# Patient Record
Sex: Female | Born: 1985 | Race: Black or African American | Hispanic: No | Marital: Single | Smoking: Current every day smoker
Health system: Southern US, Community
[De-identification: ages and names within clinical notes are randomized; demographics above are authoritative.]

## PROBLEM LIST (undated history)

## (undated) DIAGNOSIS — F319 Bipolar disorder, unspecified: Secondary | ICD-10-CM

## (undated) DIAGNOSIS — K5010 Crohn's disease of large intestine without complications: Secondary | ICD-10-CM

## (undated) DIAGNOSIS — Z87820 Personal history of traumatic brain injury: Secondary | ICD-10-CM

## (undated) DIAGNOSIS — F329 Major depressive disorder, single episode, unspecified: Secondary | ICD-10-CM

## (undated) DIAGNOSIS — F32A Depression, unspecified: Secondary | ICD-10-CM

## (undated) DIAGNOSIS — T7840XA Allergy, unspecified, initial encounter: Secondary | ICD-10-CM

## (undated) DIAGNOSIS — F419 Anxiety disorder, unspecified: Secondary | ICD-10-CM

## (undated) DIAGNOSIS — D649 Anemia, unspecified: Secondary | ICD-10-CM

## (undated) DIAGNOSIS — K219 Gastro-esophageal reflux disease without esophagitis: Secondary | ICD-10-CM

## (undated) DIAGNOSIS — Z8709 Personal history of other diseases of the respiratory system: Secondary | ICD-10-CM

## (undated) HISTORY — DX: Anemia, unspecified: D64.9

## (undated) HISTORY — PX: WISDOM TOOTH EXTRACTION: SHX21

## (undated) HISTORY — DX: Bipolar disorder, unspecified: F31.9

## (undated) HISTORY — DX: Crohn's disease of large intestine without complications: K50.10

## (undated) HISTORY — DX: Allergy, unspecified, initial encounter: T78.40XA

## (undated) HISTORY — PX: INNER EAR SURGERY: SHX679

## (undated) HISTORY — PX: UMBILICAL HERNIA REPAIR: SHX196

## (undated) HISTORY — PX: OTHER SURGICAL HISTORY: SHX169

## (undated) HISTORY — PX: TONSILLECTOMY: SUR1361

---
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
## 2005-01-26 ENCOUNTER — Encounter (INDEPENDENT_AMBULATORY_CARE_PROVIDER_SITE_OTHER): Payer: Self-pay | Admitting: Specialist

## 2005-01-26 ENCOUNTER — Inpatient Hospital Stay (HOSPITAL_COMMUNITY): Admission: AD | Admit: 2005-01-26 | Discharge: 2005-01-29 | Payer: Self-pay | Admitting: *Deleted

## 2005-01-26 ENCOUNTER — Ambulatory Visit: Payer: Self-pay | Admitting: Obstetrics & Gynecology

## 2005-02-26 ENCOUNTER — Inpatient Hospital Stay (HOSPITAL_COMMUNITY): Admission: AD | Admit: 2005-02-26 | Discharge: 2005-02-27 | Payer: Self-pay | Admitting: Obstetrics & Gynecology

## 2005-02-26 ENCOUNTER — Ambulatory Visit: Payer: Self-pay | Admitting: Certified Nurse Midwife

## 2005-02-26 ENCOUNTER — Emergency Department (HOSPITAL_COMMUNITY): Admission: AD | Admit: 2005-02-26 | Discharge: 2005-02-26 | Payer: Self-pay | Admitting: Family Medicine

## 2005-03-10 ENCOUNTER — Inpatient Hospital Stay (HOSPITAL_COMMUNITY): Admission: AD | Admit: 2005-03-10 | Discharge: 2005-03-10 | Payer: Self-pay | Admitting: Obstetrics and Gynecology

## 2005-12-15 ENCOUNTER — Emergency Department (HOSPITAL_COMMUNITY): Admission: EM | Admit: 2005-12-15 | Discharge: 2005-12-15 | Payer: Self-pay | Admitting: Emergency Medicine

## 2006-11-03 ENCOUNTER — Emergency Department (HOSPITAL_COMMUNITY): Admission: EM | Admit: 2006-11-03 | Discharge: 2006-11-03 | Payer: Self-pay | Admitting: Emergency Medicine

## 2007-02-19 ENCOUNTER — Inpatient Hospital Stay (HOSPITAL_COMMUNITY): Admission: AD | Admit: 2007-02-19 | Discharge: 2007-02-19 | Payer: Self-pay | Admitting: Obstetrics & Gynecology

## 2007-02-19 ENCOUNTER — Ambulatory Visit: Payer: Self-pay | Admitting: *Deleted

## 2007-02-28 ENCOUNTER — Emergency Department (HOSPITAL_COMMUNITY): Admission: EM | Admit: 2007-02-28 | Discharge: 2007-03-01 | Payer: Self-pay | Admitting: Emergency Medicine

## 2007-04-24 ENCOUNTER — Encounter: Payer: Self-pay | Admitting: Obstetrics and Gynecology

## 2007-04-24 ENCOUNTER — Ambulatory Visit: Payer: Self-pay | Admitting: Obstetrics & Gynecology

## 2007-05-02 ENCOUNTER — Ambulatory Visit (HOSPITAL_COMMUNITY): Admission: RE | Admit: 2007-05-02 | Discharge: 2007-05-02 | Payer: Self-pay | Admitting: Obstetrics and Gynecology

## 2007-05-22 ENCOUNTER — Ambulatory Visit: Payer: Self-pay | Admitting: Obstetrics & Gynecology

## 2007-05-26 ENCOUNTER — Emergency Department (HOSPITAL_COMMUNITY): Admission: EM | Admit: 2007-05-26 | Discharge: 2007-05-26 | Payer: Self-pay | Admitting: Emergency Medicine

## 2007-06-10 ENCOUNTER — Ambulatory Visit: Payer: Self-pay | Admitting: Obstetrics and Gynecology

## 2007-06-10 ENCOUNTER — Inpatient Hospital Stay (HOSPITAL_COMMUNITY): Admission: AD | Admit: 2007-06-10 | Discharge: 2007-06-10 | Payer: Self-pay | Admitting: Obstetrics and Gynecology

## 2007-06-12 ENCOUNTER — Inpatient Hospital Stay (HOSPITAL_COMMUNITY): Admission: AD | Admit: 2007-06-12 | Discharge: 2007-06-15 | Payer: Self-pay | Admitting: Obstetrics & Gynecology

## 2007-06-12 ENCOUNTER — Ambulatory Visit: Payer: Self-pay | Admitting: Obstetrics and Gynecology

## 2007-06-12 ENCOUNTER — Ambulatory Visit: Payer: Self-pay | Admitting: Obstetrics & Gynecology

## 2007-12-27 ENCOUNTER — Emergency Department (HOSPITAL_COMMUNITY): Admission: EM | Admit: 2007-12-27 | Discharge: 2007-12-27 | Payer: Self-pay | Admitting: Emergency Medicine

## 2008-03-28 ENCOUNTER — Emergency Department (HOSPITAL_COMMUNITY): Admission: EM | Admit: 2008-03-28 | Discharge: 2008-03-28 | Payer: Self-pay | Admitting: Emergency Medicine

## 2008-04-03 ENCOUNTER — Emergency Department (HOSPITAL_COMMUNITY): Admission: EM | Admit: 2008-04-03 | Discharge: 2008-04-03 | Payer: Self-pay | Admitting: Emergency Medicine

## 2008-09-26 ENCOUNTER — Emergency Department (HOSPITAL_COMMUNITY): Admission: EM | Admit: 2008-09-26 | Discharge: 2008-09-26 | Payer: Self-pay | Admitting: Family Medicine

## 2008-12-09 ENCOUNTER — Ambulatory Visit (HOSPITAL_COMMUNITY): Admission: RE | Admit: 2008-12-09 | Discharge: 2008-12-09 | Payer: Self-pay | Admitting: Obstetrics

## 2008-12-23 ENCOUNTER — Ambulatory Visit (HOSPITAL_COMMUNITY): Admission: RE | Admit: 2008-12-23 | Discharge: 2008-12-23 | Payer: Self-pay | Admitting: Obstetrics

## 2009-02-02 ENCOUNTER — Ambulatory Visit (HOSPITAL_COMMUNITY): Admission: RE | Admit: 2009-02-02 | Discharge: 2009-02-02 | Payer: Self-pay | Admitting: Obstetrics

## 2009-05-08 ENCOUNTER — Inpatient Hospital Stay (HOSPITAL_COMMUNITY): Admission: AD | Admit: 2009-05-08 | Discharge: 2009-05-10 | Payer: Self-pay | Admitting: Obstetrics

## 2010-05-03 ENCOUNTER — Emergency Department (HOSPITAL_COMMUNITY)
Admission: EM | Admit: 2010-05-03 | Discharge: 2010-05-03 | Payer: Self-pay | Source: Home / Self Care | Admitting: Family Medicine

## 2010-05-15 DIAGNOSIS — Z8709 Personal history of other diseases of the respiratory system: Secondary | ICD-10-CM

## 2010-05-15 HISTORY — DX: Personal history of other diseases of the respiratory system: Z87.09

## 2010-05-15 NOTE — L&D Delivery Note (Signed)
Delivery Note At 12:52 PM a viable female was delivered via  (Presentation: ;  ).  APGAR: 9, 10; weight .   Placenta status: , .  Cord: 3 vessels with the following complications: .  Cord pH: not done  Anesthesia: Epidural  Episiotomy:  Lacerations:  Suture Repair: 2.0 Est. Blood Loss (mL):   Mom to postpartum.  Baby to nursery-stable.  Lindzee Gouge A 01/31/2011, 1:03 PM

## 2010-06-05 ENCOUNTER — Encounter: Payer: Self-pay | Admitting: *Deleted

## 2010-06-06 ENCOUNTER — Encounter: Payer: Self-pay | Admitting: Obstetrics

## 2010-07-25 LAB — POCT PREGNANCY, URINE: Preg Test, Ur: NEGATIVE

## 2010-08-04 LAB — HEPATITIS B SURFACE ANTIGEN: Hepatitis B Surface Ag: NEGATIVE

## 2010-08-04 LAB — ANTIBODY SCREEN: Antibody Screen: NEGATIVE

## 2010-08-15 LAB — CBC
HCT: 32.7 % — ABNORMAL LOW (ref 36.0–46.0)
Hemoglobin: 10 g/dL — ABNORMAL LOW (ref 12.0–15.0)
Hemoglobin: 11 g/dL — ABNORMAL LOW (ref 12.0–15.0)
MCV: 92.6 fL (ref 78.0–100.0)
Platelets: 224 10*3/uL (ref 150–400)
Platelets: 276 10*3/uL (ref 150–400)
RBC: 3.21 MIL/uL — ABNORMAL LOW (ref 3.87–5.11)
RBC: 3.54 MIL/uL — ABNORMAL LOW (ref 3.87–5.11)
RDW: 14.6 % (ref 11.5–15.5)
WBC: 6.9 10*3/uL (ref 4.0–10.5)

## 2010-08-15 LAB — RPR: RPR Ser Ql: NONREACTIVE

## 2010-08-22 ENCOUNTER — Inpatient Hospital Stay (INDEPENDENT_AMBULATORY_CARE_PROVIDER_SITE_OTHER)
Admission: RE | Admit: 2010-08-22 | Discharge: 2010-08-22 | Disposition: A | Discharge status: Home / Self Care | Payer: Medicaid Other | Source: Ambulatory Visit | Attending: Emergency Medicine | Admitting: Emergency Medicine

## 2010-08-22 DIAGNOSIS — J309 Allergic rhinitis, unspecified: Secondary | ICD-10-CM

## 2010-08-22 DIAGNOSIS — J45909 Unspecified asthma, uncomplicated: Secondary | ICD-10-CM

## 2010-08-22 DIAGNOSIS — H1045 Other chronic allergic conjunctivitis: Secondary | ICD-10-CM

## 2010-08-28 ENCOUNTER — Inpatient Hospital Stay (INDEPENDENT_AMBULATORY_CARE_PROVIDER_SITE_OTHER)
Admission: RE | Admit: 2010-08-28 | Discharge: 2010-08-28 | Disposition: A | Discharge status: Home / Self Care | Payer: Medicaid Other | Source: Ambulatory Visit | Attending: Emergency Medicine | Admitting: Emergency Medicine

## 2010-08-28 DIAGNOSIS — H1045 Other chronic allergic conjunctivitis: Secondary | ICD-10-CM

## 2010-08-28 DIAGNOSIS — H1130 Conjunctival hemorrhage, unspecified eye: Secondary | ICD-10-CM

## 2010-09-27 NOTE — Op Note (Signed)
NAME:  Julie Mcconnell, Julie Mcconnell NO.:  [id number]   MEDICAL RECORD NO.:  [id number]          PATIENT TYPE:  INP   LOCATION:                                FACILITY:  [facility]   PHYSICIAN:  Jonnie Kind, M.D. DATE OF BIRTH:  18-Jun-1985   DATE OF PROCEDURE:  DATE OF DISCHARGE:                               OPERATIVE REPORT   DELIVERY TIME:  5:56 a.m. on the 29th.   SUMMARY:  [name] progressed through labor in her vaginal birth after  cesarean trial of labor.  She was 3 to 4 cm, 80%, minus 1 at vertex  presentation at midnight when she had amniotomy with moderate meconium  identified.  She progressed nicely on Pitocin augmentation of labor  reaching 8 cm at 4 a.m. and progressing to completely dilated by 5:15.  She pushed for approximately 30 minutes, delivering from a right occiput  anterior position, a healthy female infant, Apgars 9, 9.  The delivery was  notable for a second median perineal laceration and a left periurethral  laceration.  There was nuchal cord x1.  The baby delivered without any  difficulty.  The right shoulder which was in posterior position being  released first.  The baby was otherwise delivered easily.  There has  been light meconium earlier.  The baby cried vigorously as soon as the  head was delivered, so no DeLee suction was necessary or attempted.  The  Apgars 9, 9 were assigned.  Weight _____.  Secondary perineal laceration  was repaired under local anesthesia as was the left periurethral  laceration.  The periurethral area had four interrupted sutures of 2-0  plain, sewed very superficially, and the posterior perineal body was  repaired using continuous running 2-0 Vicryl with good tissue edge  approximation.  EBL 500 cc.  Placenta was intact with membranes  requiring teasing out but visually intact.      Jonnie Kind, M.D.  Electronically Signed     JVF/MEDQ  D:  06/13/2007  T:  06/13/2007  Job:  [id number]

## 2010-09-30 NOTE — Discharge Summary (Signed)
Julie Mcconnell, HUMM NO.:  [id number]   MEDICAL RECORD NO.:  [id number]          PATIENT TYPE:  INP   LOCATION:  9137                          FACILITY:  [facility]   PHYSICIAN:  Cranston Neighbor, M.D.DATE OF BIRTH:  07/16/1985   DATE OF ADMISSION:  01/26/2005  DATE OF DISCHARGE:  01/29/2005                                 DISCHARGE SUMMARY   ADMISSION DIAGNOSES:  1.  Intrauterine pregnancy at 39-6/7 weeks.  2.  Fetal bradycardia.  3.  Active herpetic lesions.   DISCHARGE DIAGNOSES:  1.  Intrauterine pregnancy at 39-6/7 weeks.  2.  Fetal bradycardia.  3.  Active herpetic lesions.   PROCEDURE:  Lower transverse cesarean section.  Please see operative report  for more information.   ADMITTING HISTORY AND PHYSICAL:  The patient is a 25 year old G1 at 39-6/7  weeks sent in __________ for variable decelerations and had an episode of  fetal bradycardia in MAU.  Also states she has an active herpetic lesion.   She has no allergies.   MEDICATIONS:  She takes albuterol and Zyrtec.   PAST MEDICAL HISTORY:  Asthma.   SOCIAL HISTORY:  She took four cigarettes a day, no ethanol or drugs.  She  lives in [place].   PHYSICAL EXAMINATION:  VITAL SIGNS:  Temperature 98.1, pulse 96, respiratory  rate 18, blood pressure 122/93.  GENERAL:  A well-developed, well-nourished female in no acute distress.  HEENT:  Normocephalic, atraumatic.  CHEST:  Lungs were clear to auscultation bilaterally.  CARDIAC:  Normal S1, S2, no murmurs, gallops or rubs.  ABDOMEN:  Gravid.  PELVIC:  She is 1 cm, -3 and 30%.   As the patient stated she had an active herpetic lesion, a C-section was  performed.  The fetal bradycardia resolved prior to C-section.   HOSPITAL COURSE:  The patient had her C-section on January 26, 2005.  The  patient remained stable.  Past the C-section, her vital signs remained  stable.  The patient had no complaints.  She was discharged on postop day  #3.  The  patient was discharged to [facility].  She was to follow up in  six weeks at [facility] for an IUD as well as a postpartum check.  Her  activity was no sex or anything vaginally for six weeks and not to lift  anything heavier than the baby.   Her medications were Percocet, ibuprofen, Colace, iron and prenatal  vitamins.  Her Percocet, she was given 20 of those.     ______________________________  Julie Mcconnell. Julie Mormon, MD    ______________________________  Cranston Neighbor, M.D.    ABC/MEDQ  D:  01/29/2005  T:  01/30/2005  Job:  [id number]

## 2010-09-30 NOTE — Op Note (Signed)
NAME:  Julie Mcconnell, Julie Mcconnell NO.:  [id number]   MEDICAL RECORD NO.:  [id number]          PATIENT TYPE:  INP   LOCATION:  9137                          FACILITY:  [facility]   PHYSICIAN:  Guss Bunde, M.D. DATE OF BIRTH:  [birth year]   DATE OF PROCEDURE:  01/26/2005  DATE OF DISCHARGE:                                 OPERATIVE REPORT   PREOPERATIVE DIAGNOSES:  A 25 year old, gravida 1, para 0 female at 62 weeks  6 days estimated gestational age with:  1.  Fetal bradycardia.  2.  Active herpes simplex virus lesions.   POSTOPERATIVE DIAGNOSES:  A 25 year old, gravida 1, para 0 female at 61  weeks 6 days estimated gestational age with:  1.  Fetal bradycardia.  2.  Active herpes simplex virus lesions.   OPERATION/PROCEDURE:  Primary low flap transverse cesarean section.   SURGEON:  Guss Bunde, M.D.   ASSISTANTJanace Hoard B. Nigel Mormon, M.D.   ANESTHESIA:  Spinal.   SPECIMENS:  To pathology, placenta.   ESTIMATED BLOOD LOSS:  800 mL.   COMPLICATIONS:  None.   FINDINGS:  A viable female infant, Apgars to be determined and reported by  the neonatologist, weight 7 pounds 14 ounces.  Nuchal cord x1.  Clear fluid.  Arterial cord PH 7.33.  Grossly normal uterus, ovaries and fallopian tube.  NICU attending at delivery.   DESCRIPTION OF PROCEDURE:  After informed consent was obtained, the patient  was taken to the operating room where spinal anesthesia was found to be  adequate.  The patient was placed in the dorsal supine position with a  leftward tilt and prepped and draped in the normal sterile fashion.  The  Foley was placed in the bladder.   A Pfannenstiel skin incision was made with the scalpel carried down to the  underlying fascia.  The fascia was incised in the midline and this incision  was extended bilaterally with the Mayo scissors.  The superior and inferior  aspects of the fascial incision were grasped with Kocher clamps, tented up,  dissected off sharply  and bluntly from underlying layers of rectus muscles.  The rectus muscles were separated in the midline.  The peritoneum was  entered sharply and this incision was extended both superiorly and  inferiorly with good visualization of the bladder.  The bladder blade was  inserted.  The uterine incision was made in transverse fashion in the lower  uterine segment.  This incision was extended bilaterally bluntly.  The  baby's head was delivered without incident and the nose and mouth were  suctioned.  The rest of the baby's body delivered easily.  The cord was  clamped and cut and the baby was handed off to the waiting pediatrician.  Cord blood was sent for type and screen and an arterial cord gas was sent  which was noted above.  .  Placenta delivered manually with three-vessel  cord and the uterus was cleared of all clots and debris.   The uterine incision was closed with 0 Vicryl in a running locked fashion.  A second suture of 0 Vicryl was  used to reinforce this incision.  The uterus  was noted to be hemostatic off tension.  The gutters were cleared of all  clots and debris, and rectus muscles and peritoneum were noted to be  hemostatic.  The fascia was closed with 0 Vicryl in a running fashion.  Good  hemostasis was noted once again.  The subcutaneous tissue was copiously  irrigated and noted to be hemostatic.  The skin was closed with staples.  The patient tolerated the procedure well.  Sponge, lab, instrument, and  needle counts were x2.  The patient went to the recovery room in stable  condition.           ______________________________  Guss Bunde, M.D.     KHL/MEDQ  D:  01/26/2005  T:  01/27/2005  Job:  [id number]

## 2010-12-27 LAB — STREP B DNA PROBE: GBS: NEGATIVE

## 2011-01-04 ENCOUNTER — Inpatient Hospital Stay (INDEPENDENT_AMBULATORY_CARE_PROVIDER_SITE_OTHER)
Admission: RE | Admit: 2011-01-04 | Discharge: 2011-01-04 | Disposition: A | Discharge status: Home / Self Care | Payer: Medicaid Other | Source: Ambulatory Visit | Attending: Emergency Medicine | Admitting: Emergency Medicine

## 2011-01-04 DIAGNOSIS — J40 Bronchitis, not specified as acute or chronic: Secondary | ICD-10-CM

## 2011-01-31 ENCOUNTER — Encounter (HOSPITAL_COMMUNITY): Payer: Self-pay | Admitting: Anesthesiology

## 2011-01-31 ENCOUNTER — Encounter (HOSPITAL_COMMUNITY): Payer: Self-pay | Admitting: *Deleted

## 2011-01-31 ENCOUNTER — Inpatient Hospital Stay (HOSPITAL_COMMUNITY)
Admission: AD | Admit: 2011-01-31 | Discharge: 2011-02-02 | DRG: 775 | Disposition: A | Discharge status: Home / Self Care | Payer: Medicaid Other | Source: Ambulatory Visit | Attending: Obstetrics & Gynecology | Admitting: Obstetrics & Gynecology

## 2011-01-31 ENCOUNTER — Inpatient Hospital Stay (HOSPITAL_COMMUNITY): Payer: Medicaid Other | Admitting: Anesthesiology

## 2011-01-31 DIAGNOSIS — Z98891 History of uterine scar from previous surgery: Secondary | ICD-10-CM | POA: Diagnosis present

## 2011-01-31 DIAGNOSIS — IMO0001 Reserved for inherently not codable concepts without codable children: Secondary | ICD-10-CM

## 2011-01-31 DIAGNOSIS — O34219 Maternal care for unspecified type scar from previous cesarean delivery: Principal | ICD-10-CM | POA: Diagnosis present

## 2011-01-31 DIAGNOSIS — IMO0002 Reserved for concepts with insufficient information to code with codable children: Secondary | ICD-10-CM | POA: Clinically undetermined

## 2011-01-31 LAB — CBC
HCT: 31.6 % — ABNORMAL LOW (ref 36.0–46.0)
MCH: 30.3 pg (ref 26.0–34.0)
MCHC: 33.2 g/dL (ref 30.0–36.0)
RDW: 14.3 % (ref 11.5–15.5)

## 2011-01-31 LAB — RPR: RPR Ser Ql: NONREACTIVE

## 2011-01-31 MED ORDER — FERROUS SULFATE 325 (65 FE) MG PO TABS
325.0000 mg | ORAL_TABLET | Freq: Two times a day (BID) | ORAL | Status: DC
  Administered 2011-01-31 – 2011-02-02 (×4): 325 mg via ORAL
  Filled 2011-01-31 (×4): qty 1

## 2011-01-31 MED ORDER — ZOLPIDEM TARTRATE 5 MG PO TABS: 5.0000 mg | ORAL_TABLET | Freq: Every evening | ORAL | Status: DC | PRN

## 2011-01-31 MED ORDER — ONDANSETRON HCL 4 MG PO TABS: 4.0000 mg | ORAL_TABLET | ORAL | Status: DC | PRN

## 2011-01-31 MED ORDER — DIPHENHYDRAMINE HCL 25 MG PO CAPS: 25.0000 mg | ORAL_CAPSULE | Freq: Four times a day (QID) | ORAL | Status: DC | PRN

## 2011-01-31 MED ORDER — PHENYLEPHRINE 40 MCG/ML (10ML) SYRINGE FOR IV PUSH (FOR BLOOD PRESSURE SUPPORT)
80.0000 ug | PREFILLED_SYRINGE | INTRAVENOUS | Status: DC | PRN
  Filled 2011-01-31: qty 5

## 2011-01-31 MED ORDER — TERBUTALINE SULFATE 1 MG/ML IJ SOLN: 0.2500 mg | Freq: Once | INTRAMUSCULAR | Status: DC | PRN

## 2011-01-31 MED ORDER — LANOLIN HYDROUS EX OINT: TOPICAL_OINTMENT | CUTANEOUS | Status: DC | PRN

## 2011-01-31 MED ORDER — ONDANSETRON HCL 4 MG/2ML IJ SOLN: 4.0000 mg | INTRAMUSCULAR | Status: DC | PRN

## 2011-01-31 MED ORDER — LACTATED RINGERS IV SOLN: 500.0000 mL | Freq: Once | INTRAVENOUS | Status: DC

## 2011-01-31 MED ORDER — BENZOCAINE-MENTHOL 20-0.5 % EX AERO: 1.0000 "application " | INHALATION_SPRAY | CUTANEOUS | Status: DC | PRN

## 2011-01-31 MED ORDER — OXYCODONE-ACETAMINOPHEN 5-325 MG PO TABS
2.0000 | ORAL_TABLET | ORAL | Status: DC | PRN
  Filled 2011-01-31: qty 1

## 2011-01-31 MED ORDER — ONDANSETRON HCL 4 MG/2ML IJ SOLN: 4.0000 mg | Freq: Four times a day (QID) | INTRAMUSCULAR | Status: DC | PRN

## 2011-01-31 MED ORDER — SIMETHICONE 80 MG PO CHEW: 80.0000 mg | CHEWABLE_TABLET | ORAL | Status: DC | PRN

## 2011-01-31 MED ORDER — FENTANYL 2.5 MCG/ML BUPIVACAINE 1/10 % EPIDURAL INFUSION (WH - ANES)
14.0000 mL/h | INTRAMUSCULAR | Status: DC
  Administered 2011-01-31 (×3): 14 mL/h via EPIDURAL
  Filled 2011-01-31 (×3): qty 60

## 2011-01-31 MED ORDER — EPHEDRINE 5 MG/ML INJ
10.0000 mg | INTRAVENOUS | Status: DC | PRN
  Filled 2011-01-31 (×2): qty 4

## 2011-01-31 MED ORDER — OXYTOCIN 20 UNITS IN LACTATED RINGERS INFUSION - SIMPLE
125.0000 mL/h | Freq: Once | INTRAVENOUS | Status: AC
  Administered 2011-01-31: 999 mL/h via INTRAVENOUS

## 2011-01-31 MED ORDER — ACETAMINOPHEN 325 MG PO TABS: 650.0000 mg | ORAL_TABLET | ORAL | Status: DC | PRN

## 2011-01-31 MED ORDER — LACTATED RINGERS IV SOLN
INTRAVENOUS | Status: DC
  Administered 2011-01-31: 03:00:00 via INTRAVENOUS
  Administered 2011-01-31: 125 mL/h via INTRAVENOUS
  Administered 2011-01-31: 999 mL/h via INTRAVENOUS

## 2011-01-31 MED ORDER — PRENATAL PLUS 27-1 MG PO TABS
1.0000 | ORAL_TABLET | Freq: Every day | ORAL | Status: DC
  Administered 2011-01-31 – 2011-02-02 (×3): 1 via ORAL
  Filled 2011-01-31 (×3): qty 1

## 2011-01-31 MED ORDER — IBUPROFEN 600 MG PO TABS
600.0000 mg | ORAL_TABLET | Freq: Four times a day (QID) | ORAL | Status: DC
  Administered 2011-01-31 – 2011-02-02 (×8): 600 mg via ORAL
  Filled 2011-01-31 (×8): qty 1

## 2011-01-31 MED ORDER — OXYTOCIN BOLUS FROM INFUSION
500.0000 mL | Freq: Once | INTRAVENOUS | Status: DC
  Filled 2011-01-31: qty 500
  Filled 2011-01-31: qty 1000

## 2011-01-31 MED ORDER — IBUPROFEN 600 MG PO TABS: 600.0000 mg | ORAL_TABLET | Freq: Four times a day (QID) | ORAL | Status: DC | PRN

## 2011-01-31 MED ORDER — SENNOSIDES-DOCUSATE SODIUM 8.6-50 MG PO TABS: 2.0000 | ORAL_TABLET | Freq: Every day | ORAL | Status: DC

## 2011-01-31 MED ORDER — OXYCODONE-ACETAMINOPHEN 5-325 MG PO TABS
1.0000 | ORAL_TABLET | ORAL | Status: DC | PRN
  Administered 2011-02-01: 0.5 via ORAL

## 2011-01-31 MED ORDER — PHENYLEPHRINE 40 MCG/ML (10ML) SYRINGE FOR IV PUSH (FOR BLOOD PRESSURE SUPPORT)
80.0000 ug | PREFILLED_SYRINGE | INTRAVENOUS | Status: DC | PRN
  Filled 2011-01-31 (×2): qty 5

## 2011-01-31 MED ORDER — DIPHENHYDRAMINE HCL 50 MG/ML IJ SOLN: 12.5000 mg | INTRAMUSCULAR | Status: DC | PRN

## 2011-01-31 MED ORDER — DIBUCAINE 1 % RE OINT: 1.0000 "application " | TOPICAL_OINTMENT | RECTAL | Status: DC | PRN

## 2011-01-31 MED ORDER — FLEET ENEMA 7-19 GM/118ML RE ENEM: 1.0000 | ENEMA | RECTAL | Status: DC | PRN

## 2011-01-31 MED ORDER — WITCH HAZEL-GLYCERIN EX PADS: 1.0000 "application " | MEDICATED_PAD | CUTANEOUS | Status: DC | PRN

## 2011-01-31 MED ORDER — LIDOCAINE HCL (PF) 1 % IJ SOLN
30.0000 mL | INTRAMUSCULAR | Status: DC | PRN
  Filled 2011-01-31 (×2): qty 30

## 2011-01-31 MED ORDER — SODIUM BICARBONATE 8.4 % IV SOLN
INTRAVENOUS | Status: DC | PRN
  Administered 2011-01-31: 4 mL via EPIDURAL

## 2011-01-31 MED ORDER — EPHEDRINE 5 MG/ML INJ
10.0000 mg | INTRAVENOUS | Status: DC | PRN
  Filled 2011-01-31: qty 4

## 2011-01-31 MED ORDER — CITRIC ACID-SODIUM CITRATE 334-500 MG/5ML PO SOLN: 30.0000 mL | ORAL | Status: DC | PRN

## 2011-01-31 MED ORDER — LACTATED RINGERS IV SOLN: 500.0000 mL | INTRAVENOUS | Status: DC | PRN

## 2011-01-31 MED ORDER — OXYTOCIN 20 UNITS IN LACTATED RINGERS INFUSION - SIMPLE
1.0000 m[IU]/min | INTRAVENOUS | Status: DC
  Administered 2011-01-31: 2 m[IU]/min via INTRAVENOUS
  Filled 2011-01-31: qty 1000

## 2011-01-31 MED ORDER — TETANUS-DIPHTH-ACELL PERTUSSIS 5-2.5-18.5 LF-MCG/0.5 IM SUSP
0.5000 mL | Freq: Once | INTRAMUSCULAR | Status: AC
  Administered 2011-02-01: 0.5 mL via INTRAMUSCULAR

## 2011-01-31 NOTE — Progress Notes (Signed)
Dr. Ruthann Cancer at bedside and notified of pt status, FHR, and UC pattern.  AROM, will continue to monitor.

## 2011-01-31 NOTE — Progress Notes (Signed)
Dr. Ruthann Cancer notified of pt status, SVE, FHR, UC pattern.  Will continue to monitor.

## 2011-01-31 NOTE — Progress Notes (Signed)
Report given to Cyndy Freeze for transfer of care

## 2011-01-31 NOTE — Consult Note (Signed)
Neonatology Note:   Attendance at Delivery:    I was asked to attend this NSVD at term due to meconium-stained fluid. The mother is a G4P3 AB pos, GBS neg with  ROM 3-4 hours PTD, meconium in fluid. No fetal distress. Upon delivery of the head, Dr. Ruthann Cancer bulb suctioned for thick, mucousy, green fluid. I did further bulb suctioning as the cord was being clamped. Infant vigorous with good spontaneous cry and tone. Needed only minimal bulb suctioning. Ap 9/10. Lungs clear to ausc in DR. To CN to care of Pediatrician.   Caleb Popp, MD

## 2011-01-31 NOTE — Anesthesia Procedure Notes (Signed)
Epidural Patient location during procedure: OB Start time: 01/31/2011 4:43 AM  Staffing Anesthesiologist: Riccardo Dubin  Preanesthetic Checklist Completed: patient identified, site marked, surgical consent, pre-op evaluation, timeout performed, IV checked, risks and benefits discussed and monitors and equipment checked  Epidural Patient position: sitting Prep: site prepped and draped and DuraPrep Patient monitoring: continuous pulse ox and blood pressure Approach: midline Injection technique: LOR air  Needle:  Needle type: Tuohy  Needle gauge: 17 G Needle length: 9 cm Needle insertion depth: 7 cm Catheter type: closed end flexible Catheter size: 19 Gauge Catheter at skin depth: 14 cm Test dose: negative  Assessment Events: blood not aspirated, injection not painful, no injection resistance, negative IV test and paresthesia (Right side -transient)  Additional Notes Dosing of Epidural: 1st dose, through needle...... ( mg expressed as equavilent  cc's medication  from .1%Bupiv / fentanyl syringe from L&D pump)...............  106m Marcaine  2nd dose, through catheter after waiting 3 minutes.... epi 1:200K + Xylocaine 40 mg 3rd dose, through catheter, after waiting 3 minutes....Marland KitchenMarland Kitchenpi 1:200K + Xylocaine 40 mg ( 2% Xylo charted as a single dose in Epic Meds for ease of charting; actual dosing was fractionated as above, for saftey's sake)  As each dose occurred, patient was free of IV sx; and patient exhibited no evidence of SA injection.  Patient is more comfortable after epidural dosed. Please see RN's note for documentation of vital signs,and FHR which are stable.

## 2011-01-31 NOTE — Progress Notes (Signed)
Dr. Ruthann Cancer notified of pt status, SVE, intact membranes, FHR, and UC pattern.  Will continue to monitor.

## 2011-01-31 NOTE — Progress Notes (Signed)
Pt reports contractions q 5-6 minutes. ,

## 2011-01-31 NOTE — Progress Notes (Signed)
Pt states she has been contracting since 2324

## 2011-01-31 NOTE — Anesthesia Preprocedure Evaluation (Addendum)
Anesthesia Evaluation  Name, MR# and DOB Patient awake  General Assessment Comment  Reviewed: Allergy & Precautions, H&P , Patient's Chart, lab work & pertinent test results  Airway Mallampati: III TM Distance: >3 FB Neck ROM: full    Dental  (+) Teeth Intact   Pulmonary  clear to auscultation  breath sounds clear to auscultation none    Cardiovascular regular Normal    Neuro/Psych   GI/Hepatic/Renal   Endo/Other  (+)   Morbid obesity  Abdominal   Musculoskeletal   Hematology   Peds  Reproductive/Obstetrics (+) Pregnancy    Anesthesia Other Findings  Limiyted mouth opening              Anesthesia Physical Anesthesia Plan  ASA: III  Anesthesia Plan: Epidural   Post-op Pain Management:    Induction:   Airway Management Planned:   Additional Equipment:   Intra-op Plan:   Post-operative Plan:   Informed Consent: I have reviewed the patients History and Physical, chart, labs and discussed the procedure including the risks, benefits and alternatives for the proposed anesthesia with the patient or authorized representative who has indicated his/her understanding and acceptance.   Dental Advisory Given  Plan Discussed with: CRNA and Surgeon  Anesthesia Plan Comments: (Labs checked- platelets confirmed with RN in room. Fetal heart tracing, per RN, reportedly stable enough for sitting procedure. Discussed epidural, and patient consents to the procedure:  included risk of possible headache,backache, failed block, allergic reaction, and nerve injury. This patient was asked if she had any questions or concerns before the procedure started. )        Anesthesia Quick Evaluation

## 2011-01-31 NOTE — Progress Notes (Signed)
Dr. Delsa Sale at the bedside and notified of pt status, FHR, UC pattern, SVE, and epidural placement. Will continue to monitor.

## 2011-01-31 NOTE — H&P (Signed)
Julie Mcconnell is a 25 y.o. female presenting for UCs. Maternal Medical History:  Reason for admission: Reason for admission: contractions.  Reason for Admission:   nauseaContractions: Onset was 6-12 hours ago.   Frequency: regular.   Perceived severity is strong.    Fetal activity: Perceived fetal activity is normal.   Last perceived fetal movement was within the past hour.    Prenatal complications: no prenatal complications   OB History    Grav Para Term Preterm Abortions TAB SAB Ect Mult Living   4 3 3       3      Past Medical History  Diagnosis Date  . No pertinent past medical history    Past Surgical History  Procedure Date  . Cesarean section   . Tonsillectomy    Family History: family history includes Heart disease in her maternal grandmother. Social History:  reports that she has been smoking Cigarettes.  She has a 12 pack-year smoking history. She has never used smokeless tobacco. She reports that she drinks about .6 ounces of alcohol per week. She reports that she does not use illicit drugs.  Review of Systems  Constitutional: Negative for fever.  Eyes: Negative for blurred vision.  Respiratory: Negative for shortness of breath.   Gastrointestinal: Negative for nausea and vomiting.  Skin: Negative for rash.  Neurological: Negative for headaches.    Dilation: 9 Effacement (%): 100 Station: -1 Exam by:: Philis Pique, RN Blood pressure 123/75, pulse 68, temperature 98.1 F (36.7 C), temperature source Oral, resp. rate 20, height 5' 6.5" (1.689 m), weight 103.42 kg (228 lb), SpO2 98.00%. Maternal Exam:  Uterine Assessment: Contraction strength is moderate.  Contraction frequency is regular.   Abdomen: Patient reports no abdominal tenderness. Surgical scars: low transverse.   Fetal presentation: vertex  Introitus: not evaluated.     Fetal Exam Fetal Monitor Review: Variability: moderate (6-25 bpm).   Pattern: accelerations present and no decelerations.     Fetal State Assessment: Category I - tracings are normal.     Physical Exam  Constitutional: She appears well-developed.  HENT:  Head: Normocephalic.  Neck: Neck supple. No thyromegaly present.  Cardiovascular: Normal rate and regular rhythm.   Respiratory: Breath sounds normal.  GI: Soft. Bowel sounds are normal.  Skin: No rash noted.    Prenatal labs: ABO, Rh: AB/Positive/-- (03/22 0000) Antibody: Negative (03/22 0000) Rubella:   RPR: Nonreactive (03/22 0000)  HBsAg: Negative (03/22 0000)  HIV:    GBS: Negative (08/14 0000)   Assessment/Plan: 25 y.o. w/an IUP @ 17w3dw/a h/o VBAC, now in active labor  Admit Anticipate an NSVE   [name],[name] 01/31/2011, 12:24 PM

## 2011-02-01 LAB — POCT URINALYSIS DIP (DEVICE)
Bilirubin Urine: NEGATIVE
Glucose, UA: NEGATIVE
Ketones, ur: NEGATIVE
Operator id: 120861
Specific Gravity, Urine: 1.02

## 2011-02-01 LAB — CBC
Hemoglobin: 9.2 g/dL — ABNORMAL LOW (ref 12.0–15.0)
Platelets: 174 10*3/uL (ref 150–400)
RBC: 3.04 MIL/uL — ABNORMAL LOW (ref 3.87–5.11)
WBC: 9.8 10*3/uL (ref 4.0–10.5)

## 2011-02-01 MED ORDER — INFLUENZA VIRUS VACC SPLIT PF IM SUSP
0.5000 mL | Freq: Once | INTRAMUSCULAR | Status: AC
  Administered 2011-02-02: 0.5 mL via INTRAMUSCULAR
  Filled 2011-02-01: qty 0.5

## 2011-02-01 NOTE — Progress Notes (Signed)
PSYCHOSOCIAL ASSESSMENT ~ MATERNAL/CHILD  Name: Julie Mcconnell Age: 25  Referral Date: 09 /19 / 12  Reason/Source: Social situation/ CN  I. FAMILY/HOME ENVIRONMENT  A. Child's Legal Guardian __X_Parent(s) ___Grandparent ___Foster parent ___DSS_________________  Name: Carrie Mew DOB: // Age: 33  Address: [address]; [place], [place] [address]  Name: Jetta Lout, Brooke Bonito. DOB: // Age:  Address:  B. Other Household Members/Support Persons Name: Relationship: daughter DOB 05/08/09  Name: Relationship: son DOB 06/13/07  Name: Relationship: daughter DOB 01/26/05  Name: Relationship: DOB ___/___/___  C. Other Support: Eduard Clos, mother  II. PSYCHOSOCIAL DATA A. Information Source __Patient Interview _X_Family Interview __Other___________ B. Occupational hygienist __Employment: * receives SSI  _X_Medicaid [employer]: [employer] __Private Insurance: __Self Pay  _X_Food Stamps __WIC *will apply __Work First __Public Housing __Section 8  __Maternity Care Coordination/Child Service Coordination/Early Intervention  ___School: Grade:  __Other:  Loletha Grayer Cultural and Environment Information Cultural Issues Impacting Care:  III. 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 SOCIAL WORK ASSESSMENT Sw referral received to assess reason for security status, as it was reported that "pt does not want the FOB here." Pt told Sw that she requested to be a security pt because she does not want her former foster mother to visit. She explained that their relationship is strained at the present time and she didn't feel like dealing with her. She denies any issues (abuse) between her and FOB and told Sw that he can come visit with her. Pt's mother is at the bedside and  supportive. Pt has a diagnoses of MR. She lives alone with her children. She does have an open treatment CPS case with Foster Simpson, [facility] CPS. Sw spoke with [place], who does not have any concerns about the infant discharging with the mom. He plans to continue working with her. The pt appears to be appropriate with the infant. She has supplies for the baby and support from family. Sw available to assist until discharge if needed.  V. SOCIAL WORK PLAN _X__No Further Intervention Required/No Barriers to Discharge  ___Psychosocial Support and Ongoing Assessment of Needs  ___Patient/Family Education:  ___Child Protective Services Report County___________ Date___/____/____  ___Information/Referral to [employer] Resources_________________________  ___Other:

## 2011-02-01 NOTE — Progress Notes (Signed)
  Post Partum Day 1 S/P spontaneous vaginal RH status/Rubella reviewed.  Feeding: bottle Subjective: No HA, SOB, CP, F/C, breast symptoms. Normal vaginal bleeding, no clots.     Objective: BP 94/59  Pulse 79  Temp(Src) 98.2 F (36.8 C) (Oral)  Resp 18  Ht 5' 6.5" (1.689 m)  Wt 103.42 kg (228 lb)  BMI 36.25 kg/m2  SpO2 98%  Breastfeeding? Unknown   Physical Exam:  General: alert Lochia: appropriate Uterine Fundus: firm DVT Evaluation: No evidence of DVT seen on physical exam. Ext: No c/c/e  Basename 02/01/11 0515 01/31/11 0255  HGB 9.2* 10.5*  HCT 28.3* 31.6*      Assessment/Plan: 25 y.o.  PPD #1 .  normal postpartum exam Continue current postpartum care  Ambulate   LOS: 1 day   [name],Darcus Edds A 02/01/2011, 9:07 AM

## 2011-02-01 NOTE — Anesthesia Postprocedure Evaluation (Signed)
Anesthesia Post Note  Patient: Julie Mcconnell  Procedure(s) Performed: * No procedures listed *  Anesthesia type: Epidural  Patient location: Mother/Baby  Post pain: Pain level controlled  Post assessment: Post-op Vital signs reviewed  Last Vitals:  Filed Vitals:   02/01/11 0538  BP: 94/59  Pulse: 79  Temp: 98.2 F (36.8 C)  Resp: 18    Post vital signs: Reviewed  Level of consciousness: awake  Complications: No apparent anesthesia complications

## 2011-02-01 NOTE — Addendum Note (Signed)
Addendum  created 02/01/11 4037 by [place] edited:Charges VN, Notes Section

## 2011-02-01 NOTE — Progress Notes (Signed)
UR chart review completed.  

## 2011-02-02 DIAGNOSIS — IMO0002 Reserved for concepts with insufficient information to code with codable children: Secondary | ICD-10-CM | POA: Clinically undetermined

## 2011-02-02 LAB — CBC
HCT: 23.9 — ABNORMAL LOW
HCT: 30 — ABNORMAL LOW
Hemoglobin: 10.4 — ABNORMAL LOW
MCHC: 34.2
MCV: 92
Platelets: 229
RBC: 2.58 — ABNORMAL LOW
RBC: 3.27 — ABNORMAL LOW
RBC: 3.42 — ABNORMAL LOW
WBC: 6.1
WBC: 7
WBC: 8.5

## 2011-02-02 LAB — POCT URINALYSIS DIP (DEVICE)
Glucose, UA: NEGATIVE
Hgb urine dipstick: NEGATIVE
Operator id: 159681
Specific Gravity, Urine: 1.02

## 2011-02-02 LAB — COMPREHENSIVE METABOLIC PANEL
AST: 14
BUN: 8
CO2: 23
Chloride: 107
Creatinine, Ser: 0.4
GFR calc non Af Amer: >60
Glucose, Bld: 99
Total Bilirubin: 0.7

## 2011-02-02 LAB — URIC ACID: Uric Acid, Serum: 3.7

## 2011-02-02 LAB — RPR: RPR Ser Ql: NONREACTIVE

## 2011-02-02 MED ORDER — IBUPROFEN 800 MG PO TABS: 800.0000 mg | ORAL_TABLET | Freq: Three times a day (TID) | ORAL | Status: AC | PRN

## 2011-02-02 MED ORDER — FERROUS SULFATE 325 (65 FE) MG PO TABS: 325.0000 mg | ORAL_TABLET | Freq: Two times a day (BID) | ORAL | Status: DC

## 2011-02-02 NOTE — Discharge Summary (Signed)
  Obstetric Discharge Summary Reason for Admission: onset of labor Prenatal Procedures: none Intrapartum Procedures: spontaneous vaginal delivery Postpartum Procedures: none Complications-Operative and Postpartum: none  Hemoglobin  Date Value Range Status  02/01/2011 9.2* 12.0-15.0 (g/dL) Final     HCT  Date Value Range Status  02/01/2011 28.3* 36.0-46.0 (%) Final    Discharge Diagnoses: Term Pregnancy-delivered  Discharge Information: Date: 02/02/2011 Activity: pelvic rest Diet: routine Medications: Ibuprofen, FeSO4 Condition: stable Instructions: refer to routine discharge instructions Discharge to: home Follow-up Information    Follow up with Lahoma Crocker A, MD. Call in 3 weeks.   Contact information:   [address] [phone]          Newborn Data: Live born  Information for the patient's newborn:  Veneta, Sliter [[id number]]  female ; APGAR , ; weight ;  Home with mother.  [name],Shubh Chiara A 02/02/2011, 10:18 AM

## 2011-02-02 NOTE — Progress Notes (Signed)
Post Partum Day #2 S/P:spontaneous vaginal  RH status/Rubella reviewed.  Feeding: unknown Subjective: No HA, SOB, CP, F/C, breast symptoms: No. Normal vaginal bleeding, no clots.     Objective:  Blood pressure 122/81, pulse 87, temperature 98 F (36.7 C), temperature source Oral, resp. rate 20, height 5' 6.5" (1.689 m), weight 103.42 kg (228 lb), SpO2 98.00%, unknown if currently breastfeeding.   Physical Exam:  General: alert Lochia: appropriate Uterine Fundus: firm DVT Evaluation: No evidence of DVT seen on physical exam. Ext: No c/c/e  Basename 02/01/11 0515 01/31/11 0255  HGB 9.2* 10.5*  HCT 28.3* 31.6*    Assessment/Plan: 25 y.o.  PPD # 2 .  normal postpartum exam Continue current postpartum care D/C home   LOS: 2 days   [name],Jostin Rue A 02/02/2011, 10:12 AM

## 2011-02-02 NOTE — Discharge Instructions (Signed)
Anemia - Nonspecific Your exam and blood tests show you are anemic. This means your blood (hemoglobin) level is low. Normal hemoglobin values are 12-15 for females and 14-17 for males. Make a note of your hemoglobin level today. The hematocrit percent is also used to measure anemia. A normal hematocrit is 38-46 in females and 42-49 in males. Make a note of your hematocrit level today. SYMPTOMS Anemia can come on suddenly (acute). It can also come on slowly (chronic). Symptoms can include:  Minor weakness.   Dizziness.   Palpitations.  Shortness of breath.   Symptoms may be absent until half your hemoglobin is missing if it comes on slowly. Anemia due to acute blood loss from an injury or internal bleeding may require blood transfusion if the loss is severe. Hospital care is needed if you are anemic and there is significant continued blood loss. CAUSES Anemia can be due to many different causes.  Excessive bleeding from periods is a common problem in women.  Other causes can include:  Intestinal bleeding.   Poor nutrition.   Kidney, thyroid, liver, and bone marrow diseases.  TREATMENT  Stool tests for blood (Hemoccult) and additional lab tests are often needed. This determines the best treatment.   Further checking on your condition and your response to treatment is very important. It often takes many weeks to correct anemia.  Depending on the cause, treatment can include:  Supplements of iron.   Vitamins Y65 and folic acid.   Hormone medicines.  If your anemia is due to bleeding, finding the cause of the blood loss is very important. This will help avoid further problems. SEEK IMMEDIATE MEDICAL CARE IF:  You develop fainting, extreme weakness, shortness of breath, or chest pain.   You develop heavy vaginal bleeding.   You develop bloody or black, tarry stools or vomit up blood.   You develop a high fever, rash, repeated vomiting, or dehydration.  Document Released:  06/08/2004 Document Re-Released: 10/19/2009 [facility] Patient Information 2011 [place].  Postpartum Care After Vaginal Delivery After you deliver your baby, you will stay in the hospital for 24 to 72 hours, unless there were problems with the labor or delivery, or you have medical problems. While you are in the hospital, you will receive help and instructions on how to care for yourself and your baby. Your doctor will order pain medicine, in case you need it. You will have a small amount of bleeding from your vagina and should change your sanitary pad frequently. Wash your hands thoroughly with soap and water for at least 20 seconds after changing pads and using the toilet. Let the nurses know if you begin to pass blood clots or your bleeding increases. Do not flush blood clots down the toilet before having the nurse look at them, to make sure there is no placental tissue with them. If you had an intravenous, it will be removed within 24 hours, if there are no problems. The first time you get out of bed or take a shower, call the nurse to help you because you may get weak, lightheaded, or even faint. If you are breastfeeding, you may feel painful contractions of your uterus for a couple of weeks. This is normal. The contractions help your uterus get back to normal size. If you are not breastfeeding, wear a tight, binding bra and decrease your fluid intake. You may be given a medicine to dry up the milk in your breasts. Hormones should not be given to dry  up the breasts, because they can cause blood clots. You will be given your normal diet, unless you have diabetes or other medical problems.  The nurses may put an ice pack on your episiotomy (surgically enlarged opening), if you have one, to reduce the pain and puffiness (swelling). On rare occasions, you may not be able to urinate and the nurse will need to empty your bladder with a catheter. If you had a postpartum tubal ligation ("tying tubes,"  female sterilization), it should not make your stay in the hospital longer. You may have your baby in your room with you as much as you like, unless you or the baby has a problem. Use the bassinet (basket) for the baby when going to and from the nursery. Do not carry the baby. Do not leave the postpartum area. If the mother is Rh negative (lacks a protein on the red blood cells) and the baby is Rh positive, the mother should get a Rho-gam shot to prevent Rh problems with future pregnancies. You may be given written instructions for you and your baby, and necessary medicines, when you are discharged from the hospital. Be sure you understand and follow the instructions as advised. HOME CARE INSTRUCTIONS AFTER YOUR DELIVERY:  Follow instructions and take the medicines given to you.   Only take over-the-counter or prescription medicines for pain, discomfort, or fever as directed by your caregiver.   Do not take aspirin, because it can cause bleeding.   Increase your activities a little bit every day to build up your strength and endurance.   Do not drink alcohol, especially if you are breastfeeding or taking pain medicine.   Take your temperature twice a day and record it.   You may have a small amount of bleeding or spotting for 2 to 4 weeks. This is normal.   Do not use tampons or douche. Use sanitary pads.   Try to have someone stay and help you for a few days when you go home.   Try to rest or take a nap when the baby is sleeping.   If you are breastfeeding, wear a good support bra. If you are not breastfeeding, wear a tight bra, do not stimulate your nipples, and decrease your fluid intake.   Eat a healthy, nutritious diet and continue to take your prenatal vitamins.   Do not drive, do any heavy activities or travel until your caregiver tells you it is OK.   Do not have intercourse until your caregiver gives you permission to do so.   Ask your caregiver when you can begin to  exercise and what type of exercises to do.   Call your caregiver if you think you are having a problem from your delivery.   Call your pediatrician if you are having a problem with the baby.   Schedule your postpartum visit and keep it.  SEEK MEDICAL CARE IF:  You have a temperature of 100 F (37.8 C) or higher.   You have increased vaginal bleeding or are passing clots. Save any clots to show your caregiver.   You have bloody urine, or pain when you urinate.   You have a bad smelling vaginal discharge.   You have increasing pain or swelling on your episiotomy (surgically enlarged opening).   You develop a severe headache.   You feel depressed.   The episiotomy is separating.   You become dizzy or lightheaded.   You develop a rash.   You have a reaction or  problems with your medicine.   You have pain, redness, and/or swelling at the intravenous site.  SEEK IMMEDIATE MEDICAL CARE IF:  You have chest pain.   You develop shortness of breath.   You pass out.   You develop pain, with or without swelling or redness in your leg.   You develop heavy vaginal bleeding, with or without blood clots.   You develop stomach pain.   You develop a bad smelling vaginal discharge.  MAKE SURE YOU:   Understand these instructions.   Will watch your condition.   Will get help right away if you are not doing well or get worse.  Document Released: 02/26/2007 Document Re-Released: 04/19/2009 [facility] Patient Information 2011 [place].

## 2011-02-10 LAB — POCT PREGNANCY, URINE: Preg Test, Ur: NEGATIVE

## 2011-02-14 LAB — CBC
HCT: 42.7
Hemoglobin: 14.1
MCHC: 33.2
MCV: 94.6
Platelets: 267
RBC: 4.51
RDW: 13.5
WBC: 6.4

## 2011-02-14 LAB — URINALYSIS, ROUTINE W REFLEX MICROSCOPIC
Bilirubin Urine: NEGATIVE
Glucose, UA: NEGATIVE
Hgb urine dipstick: NEGATIVE
Ketones, ur: NEGATIVE
Nitrite: NEGATIVE
Protein, ur: NEGATIVE
Specific Gravity, Urine: 1.027
Urobilinogen, UA: 1
pH: 6

## 2011-02-14 LAB — BASIC METABOLIC PANEL
BUN: 9
CO2: 24
Calcium: 9.6
Chloride: 105
Creatinine, Ser: 0.64
GFR calc Af Amer: >60
GFR calc non Af Amer: >60
Glucose, Bld: 91
Potassium: 4.2
Sodium: 137

## 2011-02-14 LAB — URINE MICROSCOPIC-ADD ON

## 2011-02-20 LAB — POCT URINALYSIS DIP (DEVICE)
Nitrite: NEGATIVE
Protein, ur: 100 — AB
pH: 6.5

## 2011-02-23 LAB — URINALYSIS, ROUTINE W REFLEX MICROSCOPIC
Bilirubin Urine: NEGATIVE
Ketones, ur: 40 — AB
Nitrite: NEGATIVE
Urobilinogen, UA: 2 — ABNORMAL HIGH
pH: 6

## 2011-02-23 LAB — GC/CHLAMYDIA PROBE AMP, GENITAL: GC Probe Amp, Genital: NEGATIVE

## 2011-02-23 LAB — RAPID URINE DRUG SCREEN, HOSP PERFORMED
Amphetamines: NOT DETECTED
Cocaine: NOT DETECTED
Tetrahydrocannabinol: NOT DETECTED

## 2011-02-23 LAB — WET PREP, GENITAL: Trich, Wet Prep: NONE SEEN

## 2011-03-01 LAB — URINE MICROSCOPIC-ADD ON

## 2011-03-01 LAB — URINALYSIS, ROUTINE W REFLEX MICROSCOPIC
Glucose, UA: NEGATIVE
Hgb urine dipstick: NEGATIVE
Protein, ur: 30 — AB
pH: 6.5

## 2011-12-18 ENCOUNTER — Emergency Department (HOSPITAL_COMMUNITY)
Admission: EM | Admit: 2011-12-18 | Discharge: 2011-12-18 | Disposition: A | Discharge status: Home / Self Care | Payer: Medicaid Other | Source: Home / Self Care | Attending: Emergency Medicine | Admitting: Emergency Medicine

## 2011-12-18 ENCOUNTER — Encounter (HOSPITAL_COMMUNITY): Payer: Self-pay | Admitting: *Deleted

## 2011-12-18 DIAGNOSIS — H109 Unspecified conjunctivitis: Secondary | ICD-10-CM

## 2011-12-18 DIAGNOSIS — J019 Acute sinusitis, unspecified: Secondary | ICD-10-CM

## 2011-12-18 DIAGNOSIS — J209 Acute bronchitis, unspecified: Secondary | ICD-10-CM

## 2011-12-18 MED ORDER — AMOXICILLIN 500 MG PO CAPS: 1000.0000 mg | ORAL_CAPSULE | Freq: Three times a day (TID) | ORAL | Status: AC

## 2011-12-18 MED ORDER — BENZONATATE 200 MG PO CAPS: 200.0000 mg | ORAL_CAPSULE | Freq: Three times a day (TID) | ORAL | Status: AC | PRN

## 2011-12-18 MED ORDER — POLYMYXIN B-TRIMETHOPRIM 10000-0.1 UNIT/ML-% OP SOLN: 1.0000 [drp] | OPHTHALMIC | Status: AC

## 2011-12-18 NOTE — ED Provider Notes (Signed)
Chief Complaint  Patient presents with  . Conjunctivitis    History of Present Illness:   Julie Mcconnell is a 26 year old female who presents with both her young children today. She has had a one week history of URI symptoms with nasal congestion, sore throat, cough, but no fever or chills. She has had some yellowish to green drainage and yellowish to green sputum. Her left eye has been red, irritated, watering, and draining yellowish pus. Her vision seems a little bit blurry.  Review of Systems:  Other than noted above, the patient denies any of the following symptoms. Systemic:  No fever, chills, sweats, fatigue, myalgias, headache, or anorexia. Eye:  No redness, pain or drainage. ENT:  No earache, ear congestion, nasal congestion, sneezing, rhinorrhea, sinus pressure, sinus pain, post nasal drip, or sore throat. Lungs:  No cough, sputum production, wheezing, shortness of breath, or chest pain. GI:  No abdominal pain, nausea, vomiting, or diarrhea. Skin:  No rash or itching.  [place]:  Past medical history, family history, social history, meds, and allergies were reviewed.  Physical Exam:   Vital signs:  BP 107/72  Pulse 96  Temp 98.9 F (37.2 C) (Oral)  Resp 18  SpO2 96%  LMP 12/13/2011 General:  Alert, in no distress. Eye:  In her right eye was normal. Her left eye shows some swelling of the upper lid, but the periorbital tissues appear to be normal. The conjunctiva was injected and there was some yellowish drainage. Cornea was intact, anterior chamber was normal, PERRLA, full EOMs. ENT:  TMs and canals were normal, without erythema or inflammation.  Nasal mucosa was clear and uncongested, without drainage.  Mucous membranes were moist.  Pharynx was clear, without exudate or drainage.  There were no oral ulcerations or lesions. Neck:  Supple, no adenopathy, tenderness or mass. Lungs:  No respiratory distress.  Lungs were clear to auscultation, without wheezes, rales or rhonchi.  Breath sounds  were clear and equal bilaterally. Lungs were resonant to percussion.  No egophony. Heart:  Regular rhythm, without gallops, murmers or rubs. Skin:  Clear, warm, and dry, without rash or lesions.  Assessment:  The primary encounter diagnosis was Conjunctivitis. Diagnoses of Acute bronchitis and Acute sinus infection were also pertinent to this visit.  Plan:   1.  The following meds were prescribed:   New Prescriptions   AMOXICILLIN (AMOXIL) 500 MG CAPSULE    Take 2 capsules (1,000 mg total) by mouth 3 (three) times daily.   BENZONATATE (TESSALON) 200 MG CAPSULE    Take 1 capsule (200 mg total) by mouth 3 (three) times daily as needed for cough.   TRIMETHOPRIM-POLYMYXIN B (POLYTRIM) OPHTHALMIC SOLUTION    Place 1 drop into the left eye every 4 (four) hours.   2.  The patient was instructed in symptomatic care and handouts were given. 3.  The patient was told to return if becoming worse in any way, if no better in 3 or 4 days, and given some red flag symptoms that would indicate earlier return.   Harden Mo, MD 12/18/11 [id number]

## 2011-12-18 NOTE — ED Notes (Signed)
Pt  Reports  Symptoms  Of  Eye  Irritation      X  4  Days  Other  Family  Members  Have  Similar symptoms  As well      Symptoms  Not releived  By  otc  Eye  Drop  meds

## 2011-12-20 ENCOUNTER — Telehealth (HOSPITAL_COMMUNITY): Payer: Self-pay | Admitting: *Deleted

## 2011-12-20 NOTE — ED Notes (Signed)
Pt. called on VM 8/6 and had questions about how to take the antibiotic. It said take two 3 X /day. I called pt. back and accessed her chart. I told her that was correct. The capsules are 500 mg. and the doctor wants her to take 1000 mg. (or 2 capsules) three times per day first thing in the morning, mid day and before she goes to bed. Pt. voiced understanding. Pt. said they are so big, they make her throat hurt. She asked if she could open the capsule and put it on her tongue. I told her that would taste terrible.  I suggested she put it in a little pudding or applesauce to coat it and it should slide right down. I told her, that her throat could be sore from the sinus drainage.  Roselyn Meier 12/20/2011

## 2011-12-22 ENCOUNTER — Telehealth (HOSPITAL_COMMUNITY): Payer: Self-pay | Admitting: *Deleted

## 2011-12-22 NOTE — ED Notes (Signed)
8/8 Pt. Called and said she is taking 3000 mg. Of Amoxicillin /day and it bothers her taste buds and it puts to much pressure on her head.  States taste buds are sore and has a headache.  I told her the head pressure could be from the sinus infection. I asked if she tried Ibuprofen or Tylenol for her headaches.  She said no. I told her to try that and I would ask the doctor about something for her mouth.  8/9 Discussed with Dr. Jake Michaelis and he said it is a high dose but he wants her to continue with it. He agreed the head pressure was probably from the sinus infection. He said to tell her to try and swish and spit with Benadryl syrup for her mouth.  I called and left a message to call. Roselyn Meier 12/22/2011

## 2012-11-17 ENCOUNTER — Encounter (HOSPITAL_COMMUNITY): Payer: Self-pay

## 2012-11-17 ENCOUNTER — Emergency Department (HOSPITAL_COMMUNITY)
Admission: EM | Admit: 2012-11-17 | Discharge: 2012-11-17 | Disposition: A | Discharge status: Home / Self Care | Payer: Medicaid Other | Source: Home / Self Care | Attending: Family Medicine | Admitting: Family Medicine

## 2012-11-17 DIAGNOSIS — R1013 Epigastric pain: Secondary | ICD-10-CM

## 2012-11-17 DIAGNOSIS — R197 Diarrhea, unspecified: Secondary | ICD-10-CM

## 2012-11-17 DIAGNOSIS — G8929 Other chronic pain: Secondary | ICD-10-CM

## 2012-11-17 DIAGNOSIS — K219 Gastro-esophageal reflux disease without esophagitis: Secondary | ICD-10-CM

## 2012-11-17 DIAGNOSIS — K529 Noninfective gastroenteritis and colitis, unspecified: Secondary | ICD-10-CM

## 2012-11-17 LAB — POCT URINALYSIS DIP (DEVICE)
Ketones, ur: NEGATIVE mg/dL
Protein, ur: NEGATIVE mg/dL
Specific Gravity, Urine: 1.025 (ref 1.005–1.030)
Urobilinogen, UA: 0.2 mg/dL (ref 0.0–1.0)
pH: 6.5 (ref 5.0–8.0)

## 2012-11-17 LAB — COMPREHENSIVE METABOLIC PANEL
ALT: 17 U/L (ref 0–35)
Alkaline Phosphatase: 90 U/L (ref 39–117)
CO2: 25 mEq/L (ref 19–32)
GFR calc Af Amer: >90 mL/min (ref >90)
GFR calc non Af Amer: >90 mL/min (ref >90)
Glucose, Bld: 92 mg/dL (ref 70–99)
Potassium: 3.8 mEq/L (ref 3.5–5.1)
Sodium: 135 mEq/L (ref 135–145)
Total Bilirubin: 0.4 mg/dL (ref 0.3–1.2)

## 2012-11-17 LAB — POCT I-STAT, CHEM 8
Calcium, Ion: 1.18 mmol/L (ref 1.12–1.23)
HCT: 44 % (ref 36.0–46.0)
TCO2: 24 mmol/L (ref 0–100)

## 2012-11-17 LAB — POCT H PYLORI SCREEN: H. PYLORI SCREEN, POC: NEGATIVE

## 2012-11-17 MED ORDER — METHYLCELLULOSE (LAXATIVE) PO PACK: 1.0000 | PACK | ORAL | Status: DC | PRN

## 2012-11-17 MED ORDER — LOPERAMIDE HCL 2 MG PO CAPS: 2.0000 mg | ORAL_CAPSULE | Freq: Four times a day (QID) | ORAL | Status: DC | PRN

## 2012-11-17 MED ORDER — TRAMADOL-ACETAMINOPHEN 37.5-325 MG PO TABS: 1.0000 | ORAL_TABLET | Freq: Four times a day (QID) | ORAL | Status: DC | PRN

## 2012-11-17 MED ORDER — OMEPRAZOLE 20 MG PO CPDR: 20.0000 mg | DELAYED_RELEASE_CAPSULE | Freq: Every day | ORAL | Status: DC

## 2012-11-17 NOTE — ED Provider Notes (Signed)
Medical screening examination/treatment/procedure(s) were performed by non-physician practitioner and as supervising physician I was immediately available for consultation/collaboration.   [facility]; MD  Randa Spike, MD 11/17/12 [id number]

## 2012-11-17 NOTE — ED Provider Notes (Signed)
History    CSN: [id number] Arrival date & time 11/17/12  1529  First MD Initiated Contact with Patient 11/17/12 1557     Chief Complaint  Patient presents with  . Abdominal Pain   (Consider location/radiation/quality/duration/timing/severity/associated sxs/prior Treatment) HPI Comments: 27 year old female presents complaining of 9 months of intermittent abdominal pain, diarrhea, back pain, muscle cramping, and feeling like something is moving in her abdomen. She decided to come in today because she has finally decided this is not going to get better on its own and is actually getting a little bit worse. Initially, the diarrhea was intermittent but has become constant, occurring with every bowel movement, for the last couple of months. In the abdominal pain is across her lower abdomen and in the epigastrium and is somewhat relieved by having a bowel movement. The back pain is intermittent in the lower back as well as under the right shoulder blade and is without any alleviating or exacerbating factors. Additionally, she admits to intermittent subjective fevers, dysuria, leg swelling, vaginal discharge. She denies vomiting, dark stools, bloody stools, rash, chest pain, shortness of breath.  Patient is a 27 y.o. female presenting with abdominal pain.  Abdominal Pain Associated symptoms include abdominal pain and headaches. Pertinent negatives include no chest pain and no shortness of breath.   Past Medical History  Diagnosis Date  . No pertinent past medical history    Past Surgical History  Procedure Laterality Date  . Cesarean section    . Tonsillectomy     Family History  Problem Relation Age of Onset  . Heart disease Maternal Grandmother    History  Substance Use Topics  . Smoking status: Current Every Day Smoker -- 2.00 packs/day for 6 years    Types: Cigarettes  . Smokeless tobacco: Never Used  . Alcohol Use: 0.6 oz/week    1 Cans of beer per week   OB History   Grav Para  Term Preterm Abortions TAB SAB Ect Mult Living   4 4 4       4      Review of Systems  Constitutional: Positive for fever and fatigue. Negative for chills and activity change.  HENT: Negative for ear pain, congestion, sore throat, sneezing, postnasal drip and sinus pressure.   Eyes: Negative for pain, redness and visual disturbance.  Respiratory: Negative for cough and shortness of breath.   Cardiovascular: Positive for leg swelling. Negative for chest pain and palpitations.  Gastrointestinal: Positive for nausea, abdominal pain, diarrhea and abdominal distention. Negative for vomiting, constipation and blood in stool.  Endocrine: Positive for heat intolerance. Negative for cold intolerance, polydipsia and polyuria.  Genitourinary: Positive for dysuria, flank pain and vaginal discharge. Negative for urgency, frequency, hematuria, decreased urine volume, vaginal bleeding and pelvic pain.  Musculoskeletal: Positive for myalgias, back pain and arthralgias. Negative for joint swelling.  Skin: Negative for rash.  Neurological: Positive for headaches. Negative for dizziness, weakness and light-headedness.    Allergies  Review of patient's allergies indicates no known allergies.  Home Medications   Current Outpatient Rx  Name  Route  Sig  Dispense  Refill  . EXPIRED: ferrous sulfate 325 (65 FE) MG tablet   Oral   Take 1 tablet (325 mg total) by mouth 2 (two) times daily before a meal.   60 tablet   11   . loperamide (IMODIUM) 2 MG capsule   Oral   Take 1 capsule (2 mg total) by mouth 4 (four) times daily as needed for diarrhea  or loose stools.   30 capsule   0   . methylcellulose (CITRUCEL FIBER LAXATIVE) packet   Oral   Take 1 each by mouth as needed for constipation.   30 each   2   . omeprazole (PRILOSEC) 20 MG capsule   Oral   Take 1 capsule (20 mg total) by mouth daily.   30 capsule   2   . prenatal vitamin w/FE, FA (PRENATAL 1 + 1) 27-1 MG TABS   Oral   Take 1 tablet  by mouth daily.           . traMADol-acetaminophen (ULTRACET) 37.5-325 MG per tablet   Oral   Take 1 tablet by mouth every 6 (six) hours as needed for pain.   30 tablet   0    BP 128/88  Pulse 91  Temp(Src) 98.6 F (37 C) (Oral)  Resp 16  SpO2 96%  LMP 11/11/2012  Breastfeeding? No Physical Exam  Nursing note and vitals reviewed. Constitutional: She is oriented to person, place, and time. Vital signs are normal. She appears well-developed and well-nourished. No distress.  Obese habitus   HENT:  Head: Atraumatic.  Eyes: EOM are normal. Pupils are equal, round, and reactive to light.  Cardiovascular: Normal rate, regular rhythm and normal heart sounds.  Exam reveals no gallop and no friction rub.   No murmur heard. Pulmonary/Chest: Effort normal and breath sounds normal. No respiratory distress. She has no wheezes. She has no rales.  Abdominal: Soft. There is tenderness in the epigastric area. There is no rigidity, no rebound, no guarding, no CVA tenderness, no tenderness at McBurney's point and negative Murphy's sign.  Neurological: She is alert and oriented to person, place, and time. She has normal strength.  Skin: Skin is warm and dry. She is not diaphoretic.  Psychiatric: Judgment and thought content normal. Her affect is blunt (slightly blunted affect). Her speech is delayed (slightly slowed speech ). She is slowed.    ED Course  Procedures (including critical care time) Labs Reviewed  POCT URINALYSIS DIP (DEVICE) - Abnormal; Notable for the following:    Hgb urine dipstick TRACE (*)    All other components within normal limits  POCT I-STAT, CHEM 8 - Abnormal; Notable for the following:    BUN 3 (*)    All other components within normal limits  URINE CULTURE  COMPREHENSIVE METABOLIC PANEL  POCT PREGNANCY, URINE  POCT H PYLORI SCREEN   No results found. 1. Chronic diarrhea   2. Abdominal pain, chronic, epigastric   3. GERD (gastroesophageal reflux disease)      MDM  Her labs were all okay today, we will call her if the CMP shows any abnormalities. Given that these symptoms have been going on for 9 months only very gradual worsening, I believe she will be okay to followup with her primary care physician for further testing. At this time, we can treat her symptoms until she follows up to get more definitive stool studies done.   Meds ordered this encounter  Medications  . omeprazole (PRILOSEC) 20 MG capsule    Sig: Take 1 capsule (20 mg total) by mouth daily.    Dispense:  30 capsule    Refill:  2  . methylcellulose (CITRUCEL FIBER LAXATIVE) packet    Sig: Take 1 each by mouth as needed for constipation.    Dispense:  30 each    Refill:  2  . loperamide (IMODIUM) 2 MG capsule    Sig:  Take 1 capsule (2 mg total) by mouth 4 (four) times daily as needed for diarrhea or loose stools.    Dispense:  30 capsule    Refill:  0  . traMADol-acetaminophen (ULTRACET) 37.5-325 MG per tablet    Sig: Take 1 tablet by mouth every 6 (six) hours as needed for pain.    Dispense:  30 tablet    Refill:  0     Liam Hun, PA-C 11/17/12 1746

## 2012-11-17 NOTE — ED Notes (Signed)
C/ol  9 month or so duration of stabbing pain in abdominal area, pain in back and episodic diarrhea; LMP reportedly last week; NAD

## 2012-11-19 LAB — URINE CULTURE

## 2012-11-20 ENCOUNTER — Telehealth (HOSPITAL_COMMUNITY): Payer: Self-pay | Admitting: *Deleted

## 2012-11-20 NOTE — ED Notes (Signed)
Urine culture: 35,000 colonies Lactobacillus.  7/8 Message to [employer] PA. He said to call pt. And if she is still having symptoms to come back to recheck urine culture.  7/9 I called and left a message to call. Pt. Called back.  Pt. verified x 2 and given results.  I told her what the PA said.  She said occasionally has some burning.  She said still has all the the symptoms she came in with but has not filled her Rx.'s yet. I instructed her to get her medication and take it. If she still has symptoms then come back and be rechecked. Julie Mcconnell 11/20/2012

## 2013-06-01 ENCOUNTER — Emergency Department (HOSPITAL_COMMUNITY)
Admission: EM | Admit: 2013-06-01 | Discharge: 2013-06-01 | Disposition: A | Discharge status: Home / Self Care | Payer: Medicaid Other | Attending: Emergency Medicine | Admitting: Emergency Medicine

## 2013-06-01 ENCOUNTER — Emergency Department (HOSPITAL_COMMUNITY): Payer: Medicaid Other

## 2013-06-01 ENCOUNTER — Encounter (HOSPITAL_COMMUNITY): Payer: Self-pay | Admitting: Emergency Medicine

## 2013-06-01 DIAGNOSIS — Z79899 Other long term (current) drug therapy: Secondary | ICD-10-CM | POA: Insufficient documentation

## 2013-06-01 DIAGNOSIS — Z3202 Encounter for pregnancy test, result negative: Secondary | ICD-10-CM | POA: Insufficient documentation

## 2013-06-01 DIAGNOSIS — K5289 Other specified noninfective gastroenteritis and colitis: Secondary | ICD-10-CM | POA: Insufficient documentation

## 2013-06-01 DIAGNOSIS — F17200 Nicotine dependence, unspecified, uncomplicated: Secondary | ICD-10-CM | POA: Insufficient documentation

## 2013-06-01 DIAGNOSIS — R109 Unspecified abdominal pain: Secondary | ICD-10-CM | POA: Insufficient documentation

## 2013-06-01 DIAGNOSIS — K529 Noninfective gastroenteritis and colitis, unspecified: Secondary | ICD-10-CM

## 2013-06-01 LAB — URINALYSIS, ROUTINE W REFLEX MICROSCOPIC
Glucose, UA: NEGATIVE mg/dL
Hgb urine dipstick: NEGATIVE
Ketones, ur: 15 mg/dL — AB
NITRITE: POSITIVE — AB
PH: 6 (ref 5.0–8.0)
Protein, ur: 100 mg/dL — AB
SPECIFIC GRAVITY, URINE: 1.031 — AB (ref 1.005–1.030)
UROBILINOGEN UA: 0.2 mg/dL (ref 0.0–1.0)

## 2013-06-01 LAB — COMPREHENSIVE METABOLIC PANEL
ALBUMIN: 3.6 g/dL (ref 3.5–5.2)
ALT: 14 U/L (ref 0–35)
AST: 13 U/L (ref 0–37)
Alkaline Phosphatase: 89 U/L (ref 39–117)
BUN: 3 mg/dL — AB (ref 6–23)
CALCIUM: 9.4 mg/dL (ref 8.4–10.5)
CO2: 26 mEq/L (ref 19–32)
Chloride: 99 mEq/L (ref 96–112)
Creatinine, Ser: 0.55 mg/dL (ref 0.50–1.10)
GFR calc non Af Amer: >90 mL/min (ref >90)
GLUCOSE: 99 mg/dL (ref 70–99)
POTASSIUM: 3.4 meq/L — AB (ref 3.7–5.3)
SODIUM: 141 meq/L (ref 137–147)
TOTAL PROTEIN: 8.1 g/dL (ref 6.0–8.3)
Total Bilirubin: 0.4 mg/dL (ref 0.3–1.2)

## 2013-06-01 LAB — URINE MICROSCOPIC-ADD ON

## 2013-06-01 LAB — LIPASE, BLOOD: LIPASE: 18 U/L (ref 11–59)

## 2013-06-01 LAB — CBC
HCT: 35.6 % — ABNORMAL LOW (ref 36.0–46.0)
HEMOGLOBIN: 11.9 g/dL — AB (ref 12.0–15.0)
MCH: 28.2 pg (ref 26.0–34.0)
MCHC: 33.4 g/dL (ref 30.0–36.0)
MCV: 84.4 fL (ref 78.0–100.0)
Platelets: 377 10*3/uL (ref 150–400)
RBC: 4.22 MIL/uL (ref 3.87–5.11)
RDW: 14.6 % (ref 11.5–15.5)
WBC: 5.7 10*3/uL (ref 4.0–10.5)

## 2013-06-01 LAB — OCCULT BLOOD, POC DEVICE: Fecal Occult Bld: NEGATIVE

## 2013-06-01 LAB — CG4 I-STAT (LACTIC ACID): Lactic Acid, Venous: 0.85 mmol/L (ref 0.5–2.2)

## 2013-06-01 LAB — PREGNANCY, URINE: PREG TEST UR: NEGATIVE

## 2013-06-01 MED ORDER — CIPROFLOXACIN HCL 500 MG PO TABS: 500.0000 mg | ORAL_TABLET | Freq: Two times a day (BID) | ORAL | Status: DC

## 2013-06-01 MED ORDER — PREDNISONE 10 MG PO TABS: 20.0000 mg | ORAL_TABLET | Freq: Every day | ORAL | Status: DC

## 2013-06-01 MED ORDER — SODIUM CHLORIDE 0.9 % IV BOLUS (SEPSIS)
1000.0000 mL | Freq: Once | INTRAVENOUS | Status: AC
  Administered 2013-06-01: 1000 mL via INTRAVENOUS

## 2013-06-01 MED ORDER — PREDNISONE 20 MG PO TABS: 20.0000 mg | ORAL_TABLET | Freq: Two times a day (BID) | ORAL | Status: DC

## 2013-06-01 MED ORDER — ONDANSETRON HCL 4 MG PO TABS: 4.0000 mg | ORAL_TABLET | Freq: Four times a day (QID) | ORAL | Status: DC

## 2013-06-01 MED ORDER — IOHEXOL 300 MG/ML  SOLN
115.0000 mL | Freq: Once | INTRAMUSCULAR | Status: AC | PRN
  Administered 2013-06-01: 115 mL via INTRAVENOUS

## 2013-06-01 MED ORDER — HYDROCODONE-ACETAMINOPHEN 5-325 MG PO TABS: 2.0000 | ORAL_TABLET | ORAL | Status: DC | PRN

## 2013-06-01 MED ORDER — CIPROFLOXACIN IN D5W 400 MG/200ML IV SOLN
400.0000 mg | Freq: Once | INTRAVENOUS | Status: AC
  Administered 2013-06-01: 400 mg via INTRAVENOUS
  Filled 2013-06-01: qty 200

## 2013-06-01 MED ORDER — METRONIDAZOLE IN NACL 5-0.79 MG/ML-% IV SOLN: 500.0000 mg | Freq: Once | INTRAVENOUS | Status: DC

## 2013-06-01 MED ORDER — METRONIDAZOLE 500 MG PO TABS
500.0000 mg | ORAL_TABLET | Freq: Once | ORAL | Status: AC
  Administered 2013-06-01: 500 mg via ORAL
  Filled 2013-06-01: qty 1

## 2013-06-01 MED ORDER — DEXTROSE 5 % IV SOLN
1.0000 g | Freq: Once | INTRAVENOUS | Status: AC
  Administered 2013-06-01: 1 g via INTRAVENOUS
  Filled 2013-06-01: qty 10

## 2013-06-01 NOTE — ED Notes (Signed)
Patient transported to CT 

## 2013-06-01 NOTE — Discharge Instructions (Signed)
Colitis Followup with Dr. Ardis Hughs this week. Return to the ED if you develop new or worsening symptoms. Colitis is inflammation of the colon. Colitis can be a short-term or long-standing (chronic) illness. Crohn's disease and ulcerative colitis are 2 types of colitis which are chronic. They usually require lifelong treatment. CAUSES  There are many different causes of colitis, including:  Viruses.  Germs (bacteria).  Medicine reactions. SYMPTOMS   Diarrhea.  Intestinal bleeding.  Pain.  Fever.  Throwing up (vomiting).  Tiredness (fatigue).  Weight loss.  Bowel blockage. DIAGNOSIS  The diagnosis of colitis is based on examination and stool or blood tests. X-rays, CT scan, and colonoscopy may also be needed. TREATMENT  Treatment may include:  Fluids given through the vein (intravenously).  Bowel rest (nothing to eat or drink for a period of time).  Medicine for pain and diarrhea.  Medicines (antibiotics) that kill germs.  Cortisone medicines.  Surgery. HOME CARE INSTRUCTIONS   Get plenty of rest.  Drink enough water and fluids to keep your urine clear or pale yellow.  Eat a well-balanced diet.  Call your caregiver for follow-up as recommended. SEEK IMMEDIATE MEDICAL CARE IF:   You develop chills.  You have an oral temperature above 102 F (38.9 C), not controlled by medicine.  You have extreme weakness, fainting, or dehydration.  You have repeated vomiting.  You develop severe belly (abdominal) pain or are passing bloody or tarry stools. MAKE SURE YOU:   Understand these instructions.  Will watch your condition.  Will get help right away if you are not doing well or get worse. Document Released: 06/08/2004 Document Revised: 07/24/2011 Document Reviewed: 09/03/2009 [facility] Patient Information 2014 [place], [place].  Urinary Tract Infection Urinary tract infections (UTIs) can develop anywhere along your urinary tract. Your urinary tract is your  body's drainage system for removing wastes and extra water. Your urinary tract includes two kidneys, two ureters, a bladder, and a urethra. Your kidneys are a pair of bean-shaped organs. Each kidney is about the size of your fist. They are located below your ribs, one on each side of your spine. CAUSES Infections are caused by microbes, which are microscopic organisms, including fungi, viruses, and bacteria. These organisms are so small that they can only be seen through a microscope. Bacteria are the microbes that most commonly cause UTIs. SYMPTOMS  Symptoms of UTIs may vary by age and gender of the patient and by the location of the infection. Symptoms in young women typically include a frequent and intense urge to urinate and a painful, burning feeling in the bladder or urethra during urination. Older women and men are more likely to be tired, shaky, and weak and have muscle aches and abdominal pain. A fever may mean the infection is in your kidneys. Other symptoms of a kidney infection include pain in your back or sides below the ribs, nausea, and vomiting. DIAGNOSIS To diagnose a UTI, your caregiver will ask you about your symptoms. Your caregiver also will ask to provide a urine sample. The urine sample will be tested for bacteria and white blood cells. White blood cells are made by your body to help fight infection. TREATMENT  Typically, UTIs can be treated with medication. Because most UTIs are caused by a bacterial infection, they usually can be treated with the use of antibiotics. The choice of antibiotic and length of treatment depend on your symptoms and the type of bacteria causing your infection. HOME CARE INSTRUCTIONS  If you were prescribed antibiotics,  take them exactly as your caregiver instructs you. Finish the medication even if you feel better after you have only taken some of the medication.  Drink enough water and fluids to keep your urine clear or pale yellow.  Avoid caffeine,  tea, and carbonated beverages. They tend to irritate your bladder.  Empty your bladder often. Avoid holding urine for long periods of time.  Empty your bladder before and after sexual intercourse.  After a bowel movement, women should cleanse from front to back. Use each tissue only once. SEEK MEDICAL CARE IF:   You have back pain.  You develop a fever.  Your symptoms do not begin to resolve within 3 days. SEEK IMMEDIATE MEDICAL CARE IF:   You have severe back pain or lower abdominal pain.  You develop chills.  You have nausea or vomiting.  You have continued burning or discomfort with urination. MAKE SURE YOU:   Understand these instructions.  Will watch your condition.  Will get help right away if you are not doing well or get worse. Document Released: 02/08/2005 Document Revised: 10/31/2011 Document Reviewed: 06/09/2011 [facility] Patient Information 2014 [place].

## 2013-06-01 NOTE — ED Notes (Signed)
Pt finished with contrast beverage

## 2013-06-01 NOTE — ED Notes (Signed)
Pt given ginger ale.

## 2013-06-01 NOTE — ED Notes (Signed)
Pt reports a lump on a healed incision from a c-section. Reports abd pain and has noticed blood in her stool for the past month. Reports she has been seen for the same prior to today.

## 2013-06-01 NOTE — ED Provider Notes (Signed)
CSN: [id number]     Arrival date & time 06/01/13  1147 History   First MD Initiated Contact with Patient 06/01/13 1455     Chief Complaint  Patient presents with  . Rectal Bleeding   (Consider location/radiation/quality/duration/timing/severity/associated sxs/prior Treatment) HPI Comments: Patient is a difficult historian. She reports persistent abdominal pain with diarrhea streaks of blood for the past several months. She is unable to quantify home in much this is been going on but at least since last July when she was seen in urgent care. Things have gotten worse over the past one month and she has diarrhea anytime she eats. She denies any vomiting, fever or chills, dysuria, hematuria, vaginal bleeding or discharge. She was not seen by gastrology specialist. She also states that she noticed a "lump" to the scar of her previous C-section. There is no bleeding or drainage from the site. She denies any history of inflammatory bowel disease. She denies any dizziness, lightheadedness, chest pain or shortness of breath.  The history is provided by the patient.    Past Medical History  Diagnosis Date  . No pertinent past medical history    Past Surgical History  Procedure Laterality Date  . Cesarean section    . Tonsillectomy     Family History  Problem Relation Age of Onset  . Heart disease Maternal Grandmother    History  Substance Use Topics  . Smoking status: Current Every Day Smoker -- 2.00 packs/day for 6 years    Types: Cigarettes  . Smokeless tobacco: Never Used  . Alcohol Use: 0.6 oz/week    1 Cans of beer per week   OB History   Grav Para Term Preterm Abortions TAB SAB Ect Mult Living   4 4 4       4      Review of Systems  Constitutional: Positive for activity change and appetite change. Negative for fever.  HENT: Negative for congestion and rhinorrhea.   Respiratory: Negative for cough, chest tightness and shortness of breath.   Cardiovascular: Negative for chest  pain.  Gastrointestinal: Positive for nausea, vomiting, abdominal pain, diarrhea, blood in stool and hematochezia.  Genitourinary: Negative for dysuria, hematuria, vaginal bleeding and vaginal discharge.  Musculoskeletal: Negative for arthralgias and back pain.  Skin: Negative for rash.  Neurological: Negative for dizziness and weakness.  A complete 10 system review of systems was obtained and all systems are negative except as noted in the HPI and PMH.    Allergies  Review of patient's allergies indicates no known allergies.  Home Medications   Current Outpatient Rx  Name  Route  Sig  Dispense  Refill  . bismuth subsalicylate (PEPTO BISMOL) 262 MG/15ML suspension   Oral   Take 30 mLs by mouth every 6 (six) hours as needed.         . loperamide (IMODIUM) 2 MG capsule   Oral   Take 1 capsule (2 mg total) by mouth 4 (four) times daily as needed for diarrhea or loose stools.   30 capsule   0   . methylcellulose (CITRUCEL FIBER LAXATIVE) packet   Oral   Take 1 each by mouth as needed for constipation.   30 each   2   . prenatal vitamin w/FE, FA (PRENATAL 1 + 1) 27-1 MG TABS   Oral   Take 1 tablet by mouth daily.           . traMADol-acetaminophen (ULTRACET) 37.5-325 MG per tablet   Oral   Take  1 tablet by mouth every 6 (six) hours as needed for pain.   30 tablet   0   . ciprofloxacin (CIPRO) 500 MG tablet   Oral   Take 1 tablet (500 mg total) by mouth every 12 (twelve) hours.   10 tablet   0   . EXPIRED: ferrous sulfate 325 (65 FE) MG tablet   Oral   Take 1 tablet (325 mg total) by mouth 2 (two) times daily before a meal.   60 tablet   11   . HYDROcodone-acetaminophen (NORCO/VICODIN) 5-325 MG per tablet   Oral   Take 2 tablets by mouth every 4 (four) hours as needed.   10 tablet   0   . ondansetron (ZOFRAN) 4 MG tablet   Oral   Take 1 tablet (4 mg total) by mouth every 6 (six) hours.   12 tablet   0   . predniSONE (DELTASONE) 10 MG tablet   Oral    Take 2 tablets (20 mg total) by mouth daily.   28 tablet   0    BP 114/64  Pulse 81  Temp(Src) 98.3 F (36.8 C) (Oral)  Resp 18  SpO2 98%  LMP 05/05/2013 Physical Exam  Constitutional: She is oriented to person, place, and time. She appears well-developed and well-nourished. No distress.  HENT:  Head: Normocephalic and atraumatic.  Mouth/Throat: Oropharynx is clear and moist. No oropharyngeal exudate.  Eyes: Conjunctivae and EOM are normal. Pupils are equal, round, and reactive to light.  Neck: Normal range of motion. Neck supple.  Cardiovascular: Normal rate, regular rhythm and normal heart sounds.   Pulmonary/Chest: Effort normal and breath sounds normal. No respiratory distress.  Abdominal: Soft. There is tenderness. There is no rebound and no guarding.  Soft, no peritoneal signs. No palpable mass to well healed C section scar.  Musculoskeletal: Normal range of motion. She exhibits no edema and no tenderness.  Neurological: She is alert and oriented to person, place, and time. No cranial nerve deficit. She exhibits normal muscle tone. Coordination normal.  Skin: Skin is warm.    ED Course  Procedures (including critical care time) Labs Review Labs Reviewed  CBC - Abnormal; Notable for the following:    Hemoglobin 11.9 (*)    HCT 35.6 (*)    All other components within normal limits  COMPREHENSIVE METABOLIC PANEL - Abnormal; Notable for the following:    Potassium 3.4 (*)    BUN 3 (*)    All other components within normal limits  URINALYSIS, ROUTINE W REFLEX MICROSCOPIC - Abnormal; Notable for the following:    Color, Urine ORANGE (*)    APPearance CLOUDY (*)    Specific Gravity, Urine 1.031 (*)    Bilirubin Urine SMALL (*)    Ketones, ur 15 (*)    Protein, ur 100 (*)    Nitrite POSITIVE (*)    Leukocytes, UA SMALL (*)    All other components within normal limits  URINE MICROSCOPIC-ADD ON - Abnormal; Notable for the following:    Squamous Epithelial / LPF FEW (*)     Bacteria, UA MANY (*)    All other components within normal limits  URINE CULTURE  LIPASE, BLOOD  PREGNANCY, URINE  CG4 I-STAT (LACTIC ACID)  OCCULT BLOOD, POC DEVICE   Imaging Review Ct Abdomen Pelvis W Contrast  06/01/2013   CLINICAL DATA:  RECTAL BLEEDING, left lower quadrant pain  EXAM: CT ABDOMEN AND PELVIS WITH CONTRAST  TECHNIQUE: Multidetector CT imaging of the abdomen  and pelvis was performed using the standard protocol following bolus administration of intravenous contrast.  CONTRAST:  174m OMNIPAQUE IOHEXOL 300 MG/ML  SOLN  COMPARISON:  None.  FINDINGS: Lung bases are clear.  No pericardial fluid.  There is a low-density lesion along the falciform ligament likely representing focal fatty infiltration. No biliary duct dilatation. The gallbladder, pancreas, spleen, adrenal glands, and kidneys are normal.  Stomach, duodenum, and small bowel are normal. There is some mild bowel wall edema involving the terminal ileum (image 67 to 70). This mild bowel wall edema continues into the cecum. There is mucosal thickening of the ascending, transverse, and descending colon and sigmoid colon. . The rectum appears normal. The colon is not distended and is predominantly collapsed throughout its course.  There small lymph nodes in the ileocecal mesentery measuring 7 to 8 mm short axis (60 for 65 for example). No central mesenteric adenopathy. No retroperitoneal adenopathy.  No free fluid the pelvis. The bladder and uterus are normal. Next are normal. No pelvic lymphadenopathy. No aggressive osseous lesion.  IMPRESSION: 1. Bowel wall edema and mild mucosal thickening involving the terminal ileum and the majority of the colon (pancolitis) with some sparing of the rectum. Differential consideration include inflammatory and infectious enteritis / colitis including inflammatory bowel disease. Also consider collagen vascular disease. Ischemic colitis would be unlikely. 2. Ileocecal adenopathy is likely reactive.    Electronically Signed   By: SSuzy BouchardM.D.   On: 06/01/2013 17:00    EKG Interpretation   None       MDM   1. Colitis    Ongoing diarrhea, abdominal pain and rectal bleeding for many months. Abdomen soft without peritoneal signs. Vital stable. No distress.  Hemoglobin 11.9. Was 14 in July and previous to that was in the 9-10 range. Orthostatics negative.  Urinalysis appears to be infected. Abdomen remains soft without peritoneal signs. Patient tolerating by mouth.  CT results d/w Dr/ JArdis Hughs  Favor IBD rather than infection. Dr. JEdison Nasutistates she will see patient in the office Monday or Tuesday. He was given her phone number. Recommend starting 20 mg prednisone twice a day for presumed IBD.  Also recommends treating UTI. Doubt infectious colitis at this time as it has been ongoing for several months.  Patient understands instructions and will follow up with GI. Return precautions to the ED discussed.    SEzequiel Essex MD 06/01/13 [phone]

## 2013-06-02 ENCOUNTER — Telehealth: Payer: Self-pay

## 2013-06-02 LAB — URINE CULTURE: Colony Count: 50000

## 2013-06-02 NOTE — Telephone Encounter (Signed)
Pt has been scheduled with Dr Hilarie Fredrickson for 06/03/13 11 am pt is aware

## 2013-06-02 NOTE — Telephone Encounter (Signed)
Message copied by Barron Alvine on Mon Jun 02, 2013  9:16 AM ------      Message from: Owens Loffler P      Created: Mon Jun 02, 2013  6:28 AM       Chong Sicilian,            I spoke with ER MD about her.  Last night.  She needs first available MD appt or with extender if MD apt is going to be later than Wednesday of this week.              New diagnosis of (probable) IBD.            Thanks       ------

## 2013-06-03 ENCOUNTER — Encounter: Payer: Self-pay | Admitting: Internal Medicine

## 2013-06-03 ENCOUNTER — Other Ambulatory Visit: Payer: Medicaid Other

## 2013-06-03 ENCOUNTER — Ambulatory Visit (INDEPENDENT_AMBULATORY_CARE_PROVIDER_SITE_OTHER): Payer: Medicaid Other | Admitting: Internal Medicine

## 2013-06-03 VITALS — BP 100/60 | HR 64 | Ht 65.5 in | Wt 233.2 lb

## 2013-06-03 DIAGNOSIS — R5381 Other malaise: Secondary | ICD-10-CM

## 2013-06-03 DIAGNOSIS — R109 Unspecified abdominal pain: Secondary | ICD-10-CM

## 2013-06-03 DIAGNOSIS — R197 Diarrhea, unspecified: Secondary | ICD-10-CM

## 2013-06-03 DIAGNOSIS — R933 Abnormal findings on diagnostic imaging of other parts of digestive tract: Secondary | ICD-10-CM

## 2013-06-03 DIAGNOSIS — R5383 Other fatigue: Secondary | ICD-10-CM

## 2013-06-03 MED ORDER — MOVIPREP 100 G PO SOLR: 1.0000 | Freq: Once | ORAL | Status: DC

## 2013-06-03 NOTE — Progress Notes (Signed)
Patient ID: Julie Mcconnell, female   DOB: Apr 20, 1986, 28 y.o.   MRN: [id number] HPI: [name] is a 28 year old female with little past medical history who is seen today with her mother in consultation at the request of her emergency room doctor, Dr. Wyvonnia Dusky, to evaluate recent bloody diarrhea, abdominal pain, and abnormal GI imaging. She is here today with her mother. She reports her last one to 2 months having developed bloody diarrhea with lower and primarily left-sided abdominal pain. She reports 5-15 loose and at times bloody bowel movements on a daily basis. This has been worse over the last 2 months though she reports over the last 2-3 years she's had loose, frequent stools. The abdominal pain and bloody nature to the diarrhea was the new symptom.  She had some diaphoresis as well as nausea with scant emesis prior to her ER visit. No hematemesis. No definite fevers. She reports friends have told her she might have lost weight but she doesn't know for sure. She denies heartburn, dysphagia or odynophagia. No melena. She does endorse overall increased fatigue and malaise.  No known family history of IBD or colon cancer  After her ER visit she was discharged with ciprofloxacin 500 mg twice daily which she is taking, Norco for pain, prednisone 20 mg daily which she is taking, and Zofran when necessary  Since leaving the ER she's had significant improvement in her abdominal pain but she still has some abdominal discomfort in her left abdomen. Her diarrhea has reduced dramatically  Past Medical History  Diagnosis Date  . No pertinent past medical history     Past Surgical History  Procedure Laterality Date  . Cesarean section    . Tonsillectomy      Current Outpatient Prescriptions  Medication Sig Dispense Refill  . bismuth subsalicylate (PEPTO BISMOL) 262 MG/15ML suspension Take 30 mLs by mouth every 6 (six) hours as needed.      . ciprofloxacin (CIPRO) 500 MG tablet Take 1 tablet (500  mg total) by mouth every 12 (twelve) hours.  10 tablet  0  . HYDROcodone-acetaminophen (NORCO/VICODIN) 5-325 MG per tablet Take 2 tablets by mouth every 4 (four) hours as needed.  10 tablet  0  . loperamide (IMODIUM) 2 MG capsule Take 1 capsule (2 mg total) by mouth 4 (four) times daily as needed for diarrhea or loose stools.  30 capsule  0  . ondansetron (ZOFRAN) 4 MG tablet Take 1 tablet (4 mg total) by mouth every 6 (six) hours.  12 tablet  0  . predniSONE (DELTASONE) 10 MG tablet Take 2 tablets (20 mg total) by mouth daily.  28 tablet  0  . ferrous sulfate 325 (65 FE) MG tablet Take 1 tablet (325 mg total) by mouth 2 (two) times daily before a meal.  60 tablet  11  . methylcellulose (CITRUCEL FIBER LAXATIVE) packet Take 1 each by mouth as needed for constipation.  30 each  2  . MOVIPREP 100 G SOLR Take 1 kit (200 g total) by mouth once.  1 kit  0  . prenatal vitamin w/FE, FA (PRENATAL 1 + 1) 27-1 MG TABS Take 1 tablet by mouth daily.        . traMADol-acetaminophen (ULTRACET) 37.5-325 MG per tablet Take 1 tablet by mouth every 6 (six) hours as needed for pain.  30 tablet  0   No current facility-administered medications for this visit.    No Known Allergies  Family History  Problem Relation Age of  Onset  . Heart disease Maternal Grandmother     History  Substance Use Topics  . Smoking status: Current Every Day Smoker -- 2.00 packs/day for 6 years    Types: Cigarettes  . Smokeless tobacco: Never Used  . Alcohol Use: 0.6 oz/week    1 Cans of beer per week    ROS: As per history of present illness, otherwise negative  BP 100/60  Pulse 64  Ht 5' 5.5" (1.664 m)  Wt 233 lb 4 oz (105.802 kg)  BMI 38.21 kg/m2  LMP 05/05/2013 Constitutional: Well-developed and well-nourished. No distress. Smells of tobacco smoke HEENT: Normocephalic and atraumatic. Oropharynx is clear and moist. No oropharyngeal exudate. Conjunctivae are normal.  No scleral icterus. Cardiovascular: Normal rate,  regular rhythm and intact distal pulses. No M/R/G Pulmonary/chest: Effort normal and breath sounds normal. No wheezing, rales or rhonchi. Abdominal: Soft, mild tenderness in the left mid and lower abdomen without rebound or guarding, nondistended. Bowel sounds active throughout. There are no masses palpable.  Extremities: no clubbing, cyanosis, or edema Neurological: Alert and oriented to person place and time. Skin: Skin is warm and dry. No rashes noted. Psychiatric: Normal mood and affect. Behavior is normal.  RELEVANT LABS AND IMAGING: CBC    Component Value Date/Time   WBC 5.7 06/01/2013 1210   RBC 4.22 06/01/2013 1210   HGB 11.9* 06/01/2013 1210   HCT 35.6* 06/01/2013 1210   PLT 377 06/01/2013 1210   MCV 84.4 06/01/2013 1210   MCH 28.2 06/01/2013 1210   MCHC 33.4 06/01/2013 1210   RDW 14.6 06/01/2013 1210    CMP     Component Value Date/Time   NA 141 06/01/2013 1210   K 3.4* 06/01/2013 1210   CL 99 06/01/2013 1210   CO2 26 06/01/2013 1210   GLUCOSE 99 06/01/2013 1210   BUN 3* 06/01/2013 1210   CREATININE 0.55 06/01/2013 1210   CALCIUM 9.4 06/01/2013 1210   PROT 8.1 06/01/2013 1210   ALBUMIN 3.6 06/01/2013 1210   AST 13 06/01/2013 1210   ALT 14 06/01/2013 1210   ALKPHOS 89 06/01/2013 1210   BILITOT 0.4 06/01/2013 1210   GFRNONAA >90 06/01/2013 1210   GFRAA >90 06/01/2013 1210   Lipase     Component Value Date/Time   LIPASE 18 06/01/2013 1210    FOBT 06/01/2013 -- negative, (positive result 5 years ago, unclear if any workup performed at that time)    CT ABDOMEN AND PELVIS WITH CONTRAST   TECHNIQUE: Multidetector CT imaging of the abdomen and pelvis was performed using the standard protocol following bolus administration of intravenous contrast.   CONTRAST:  170m OMNIPAQUE IOHEXOL 300 MG/ML  SOLN   COMPARISON:  None.   FINDINGS: Lung bases are clear.  No pericardial fluid.   There is a low-density lesion along the falciform ligament likely representing focal fatty  infiltration. No biliary duct dilatation. The gallbladder, pancreas, spleen, adrenal glands, and kidneys are normal.   Stomach, duodenum, and small bowel are normal. There is some mild bowel wall edema involving the terminal ileum (image 67 to 70). This mild bowel wall edema continues into the cecum. There is mucosal thickening of the ascending, transverse, and descending colon and sigmoid colon. . The rectum appears normal. The colon is not distended and is predominantly collapsed throughout its course.   There small lymph nodes in the ileocecal mesentery measuring 7 to 8 mm short axis (60 for 65 for example). No central mesenteric adenopathy. No retroperitoneal adenopathy.  No free fluid the pelvis. The bladder and uterus are normal. Next are normal. No pelvic lymphadenopathy. No aggressive osseous lesion.   IMPRESSION: 1. Bowel wall edema and mild mucosal thickening involving the terminal ileum and the majority of the colon (pancolitis) with some sparing of the rectum. Differential consideration include inflammatory and infectious enteritis / colitis including inflammatory bowel disease. Also consider collagen vascular disease. Ischemic colitis would be unlikely. 2. Ileocecal adenopathy is likely reactive.  ASSESSMENT/PLAN: 28 year old female with little past medical history who is seen today with her mother in consultation at the request of her emergency room doctor, Dr. Wyvonnia Dusky, to evaluate recent bloody diarrhea, abdominal pain, and abnormal GI imaging.  1.  Abd pain/bloody diarrhea/abnl GI imaging -- her constellation of symptoms is concerning for inflammatory bowel disease though I cannot exclude infectious colitis/enteritis. Her symptoms have improved with steroids though she is only been on this for 2 days. She's also on antibiotics with Cipro alone. I have recommended a GI pathogen panel to exclude infection, and if negative, a colonoscopy. We discussed inflammatory bowel  disease today including the natural history and chronic nature to this condition. I also explained that biopsies would help prove exactly what is going on. For now I will leave her on prednisone 20 mg which has been prescribed for 2 weeks. I do not think that this will decrease sensitivity of colonoscopy as long as it is performed in a timely fashion.  I recommended that she complete the Cipro as prescribed. She was given a handout on inflammatory bowel disease to read today, should this be her eventual diagnosis  2.  Tobacco abuse -- I recommended she try to reduce and cease tobacco use. This can certainly make IBD worse.

## 2013-06-03 NOTE — Patient Instructions (Signed)
You have been scheduled for a colonoscopy with propofol. Please follow written instructions given to you at your visit today.  Please pick up your prep kit at the pharmacy within the next 1-3 days. If you use inhalers (even only as needed), please bring them with you on the day of your procedure. Your physician has requested that you go to www.startemmi.com and enter the access code given to you at your visit today. This web site gives a general overview about your procedure. However, you should still follow specific instructions given to you by our office regarding your preparation for the procedure. Continue current medications. Your physician has requested that you go to the basement for the following lab work before leaving today: GI pathogen panel

## 2013-06-04 ENCOUNTER — Telehealth: Payer: Self-pay | Admitting: Internal Medicine

## 2013-06-04 ENCOUNTER — Other Ambulatory Visit: Payer: Medicaid Other

## 2013-06-04 DIAGNOSIS — R109 Unspecified abdominal pain: Secondary | ICD-10-CM

## 2013-06-04 NOTE — Telephone Encounter (Signed)
Spoke to pt. Told her dr. Garth Schlatter recommendations to take Aleve at the lowest dose for the least amount of time. Pt wanted to know what she was going to do about her left sided abdominal pain. I asked her if she completed her antibiotics. Pt stated she has not started them yet. I toldher to start and complete her medications as prescribed and if she is still having pain to call us back. Pt verbalized understanding.

## 2013-06-04 NOTE — Telephone Encounter (Signed)
Also needs her Moviprep resent to pharmacy.  Pt only has 2 Hydrocodone's left and requesting a refill

## 2013-06-05 LAB — GASTROINTESTINAL PATHOGEN PANEL PCR
C. DIFFICILE TOX A/B, PCR: NEGATIVE
Campylobacter, PCR: NEGATIVE
Cryptosporidium, PCR: NEGATIVE
E coli (ETEC) LT/ST PCR: NEGATIVE
E coli (STEC) stx1/stx2, PCR: NEGATIVE
E coli 0157, PCR: NEGATIVE
Giardia lamblia, PCR: NEGATIVE
Norovirus, PCR: NEGATIVE
ROTAVIRUS, PCR: NEGATIVE
Salmonella, PCR: NEGATIVE
Shigella, PCR: NEGATIVE

## 2013-06-13 ENCOUNTER — Encounter: Payer: Self-pay | Admitting: Internal Medicine

## 2013-06-13 ENCOUNTER — Telehealth: Payer: Self-pay | Admitting: Internal Medicine

## 2013-06-13 ENCOUNTER — Ambulatory Visit (AMBULATORY_SURGERY_CENTER): Payer: Medicaid Other | Admitting: Internal Medicine

## 2013-06-13 VITALS — BP 127/69 | HR 70 | Temp 98.9°F | Resp 26 | Ht 65.0 in | Wt 233.0 lb

## 2013-06-13 DIAGNOSIS — A630 Anogenital (venereal) warts: Secondary | ICD-10-CM

## 2013-06-13 DIAGNOSIS — R109 Unspecified abdominal pain: Secondary | ICD-10-CM

## 2013-06-13 DIAGNOSIS — R933 Abnormal findings on diagnostic imaging of other parts of digestive tract: Secondary | ICD-10-CM

## 2013-06-13 DIAGNOSIS — K5289 Other specified noninfective gastroenteritis and colitis: Secondary | ICD-10-CM

## 2013-06-13 DIAGNOSIS — R197 Diarrhea, unspecified: Secondary | ICD-10-CM

## 2013-06-13 DIAGNOSIS — D126 Benign neoplasm of colon, unspecified: Secondary | ICD-10-CM

## 2013-06-13 MED ORDER — PREDNISONE 10 MG PO TABS: ORAL_TABLET | ORAL | Status: DC

## 2013-06-13 MED ORDER — SODIUM CHLORIDE 0.9 % IV SOLN: 500.0000 mL | INTRAVENOUS | Status: DC

## 2013-06-13 MED ORDER — HYDROCODONE-ACETAMINOPHEN 5-325 MG PO TABS: 1.0000 | ORAL_TABLET | Freq: Four times a day (QID) | ORAL | Status: DC | PRN

## 2013-06-13 NOTE — Patient Instructions (Addendum)
Polyp information sheet given.  Prednisone rx sent in per MD.  YOU HAD AN ENDOSCOPIC PROCEDURE TODAY AT [facility]: Refer to the procedure report that was given to you for any specific questions about what was found during the examination.  If the procedure report does not answer your questions, please call your gastroenterologist to clarify.  If you requested that your care partner not be given the details of your procedure findings, then the procedure report has been included in a sealed envelope for you to review at your convenience later.  YOU SHOULD EXPECT: Some feelings of bloating in the abdomen. Passage of more gas than usual.  Walking can help get rid of the air that was put into your GI tract during the procedure and reduce the bloating. If you had a lower endoscopy (such as a colonoscopy or flexible sigmoidoscopy) you may notice spotting of blood in your stool or on the toilet paper. If you underwent a bowel prep for your procedure, then you may not have a normal bowel movement for a few days.  DIET: Your first meal following the procedure should be a light meal and then it is ok to progress to your normal diet.  A half-sandwich or bowl of soup is an example of a good first meal.  Heavy or fried foods are harder to digest and may make you feel nauseous or bloated.  Likewise meals heavy in dairy and vegetables can cause extra gas to form and this can also increase the bloating.  Drink plenty of fluids but you should avoid alcoholic beverages for 24 hours.  ACTIVITY: Your care partner should take you home directly after the procedure.  You should plan to take it easy, moving slowly for the rest of the day.  You can resume normal activity the day after the procedure however you should NOT DRIVE or use heavy machinery for 24 hours (because of the sedation medicines used during the test).    SYMPTOMS TO REPORT IMMEDIATELY: A gastroenterologist can be reached at any hour.  During  normal business hours, 8:30 AM to 5:00 PM Monday through Friday, call [phone].  After hours and on weekends, please call the GI answering service at [phone] who will take a message and have the physician on call contact you.   Following lower endoscopy (colonoscopy or flexible sigmoidoscopy):  Excessive amounts of blood in the stool  Significant tenderness or worsening of abdominal pains  Swelling of the abdomen that is new, acute  Fever of 100F or higher  FOLLOW UP: If any biopsies were taken you will be contacted by phone or by letter within the next 1-3 weeks.  Call your gastroenterologist if you have not heard about the biopsies in 3 weeks.  Our staff will call the home number listed on your records the next business day following your procedure to check on you and address any questions or concerns that you may have at that time regarding the information given to you following your procedure. This is a courtesy call and so if there is no answer at the home number and we have not heard from you through the emergency physician on call, we will assume that you have returned to your regular daily activities without incident.  SIGNATURES/CONFIDENTIALITY: You and/or your care partner have signed paperwork which will be entered into your electronic medical record.  These signatures attest to the fact that that the information above on your After Visit Summary has been  reviewed and is understood.  Full responsibility of the confidentiality of this discharge information lies with you and/or your care-partner.

## 2013-06-13 NOTE — Progress Notes (Signed)
Called to room to assist during endoscopic procedure.  Patient ID and intended procedure confirmed with present staff. Received instructions for my participation in the procedure from the performing physician.  

## 2013-06-13 NOTE — Op Note (Signed)
[place]  [address] [place], [id number]   COLONOSCOPY PROCEDURE REPORT  PATIENT: Julie Mcconnell, Julie Mcconnell  MR#: [id number] BIRTHDATE: 07-11-1985 , 28  yrs. old GENDER: Female ENDOSCOPIST: Jerene Bears, MD PROCEDURE DATE:  06/13/2013 PROCEDURE:   Colonoscopy with biopsy First Screening Colonoscopy - Avg.  risk and is 50 yrs.  old or older - No.  Prior Negative Screening - Now for repeat screening. N/A  History of Adenoma - Now for follow-up colonoscopy & has been > or = to 3 yrs.  N/A  Polyps Removed Today? No.  Recommend repeat exam, <10 yrs? Yes.  No reason given. ASA CLASS:   Class II INDICATIONS:unexplained diarrhea, Abdominal pain, and an abnormal CT. MEDICATIONS: MAC sedation, administered by CRNA and propofol (Diprivan) 331m IV  DESCRIPTION OF PROCEDURE:   After the risks benefits and alternatives of the procedure were thoroughly explained, informed consent was obtained.  A digital rectal exam revealed palpable fleshy lesion in the distal rectum.   The LB [id number] was introduced through the anus and advanced to the terminal ileum which was intubated for a short distance. No adverse events experienced.   The quality of the prep was Moviprep fair The instrument was then slowly withdrawn as the colon was fully examined.   COLON FINDINGS: Mild possible ileitis was found in the distal ileum characterized by mild erythema in erosion, query backwash ileitis versus Crohn's.  Multiple biopsies were performed using cold forceps.   Universal colitis was found throughout the entire examined colon and rectum.  The mucosa was congested, edematous, erythematous and had loss of vascularity, granularity and superficial ulcers.  This was likely consistent with Inflammatory Bowel disease.  Multiple biopsies were performed using cold forceps.   A pale colored, sessile polyp measuring 15-20 mm in size was found in rectum seen upon the retroflexed view.   This lesion approached if not approximated the dentate line, query squamous origin.  Multiple biopsies of the lesion were performed using cold forceps.  Retroflexed views revealed findings as previously discussed. The time to cecum=5 minutes 21 seconds.  Withdrawal time=12 minutes 30 seconds.  The scope was withdrawn and the procedure completed.  COMPLICATIONS: There were no complications.  ENDOSCOPIC IMPRESSION: 1.   Mild erosion and ileitis was found in the distal ileum; multiple biopsies were performed using cold forceps 2.   Pan-colitis consistent with inflammatory bowel disease; multiple biopsies were performed using cold forceps 3.   Sessile polyp measuring 15-20 mm in size was found in rectum seen upon the retroflexed view; multiple biopsies of the lesion were performed  RECOMMENDATIONS: 1.  Await pathology results 2.  Avoid all NSAIDS for the next 2 weeks. 3.  Continue prednisone 20 mg for one additional week and then taper by 5 mg every 7 days until off. 4.  Office followup in 2-3 weeks   eSigned:  JJerene Bears MD 06/13/2013 2:53 PM   cc: The Patient   PATIENT NAME:  [name], BufanoMR#: [id number]

## 2013-06-13 NOTE — Telephone Encounter (Signed)
Pt requests pain medication. Dr. Hilarie Fredrickson first suggests Ultracet but when I spoke to pt she states that medication doesn't work and requests Hydrocodone instead.  I spoke with Dr. Hilarie Fredrickson who order Hydrocodone- Acetaminophen 5 -325 mg 1 po q 6 hours PRN pain, no refills, #30.  Per Dr. Hilarie Fredrickson, this is a one time order and no further narcotic prescriptions will be given and pt informed of this.   She states she is coming back to get the prescription

## 2013-06-16 ENCOUNTER — Telehealth: Payer: Self-pay | Admitting: *Deleted

## 2013-06-16 NOTE — Telephone Encounter (Signed)
  Follow up Call-  Call back number 06/13/2013  Post procedure Call Back phone  # [phone]  Permission to leave phone message Yes     Patient questions:  Do you have a fever, pain , or abdominal swelling? no Pain Score  0 *  Have you tolerated food without any problems? yes  Have you been able to return to your normal activities? yes  Do you have any questions about your discharge instructions: Diet   no Medications  no Follow up visit  no  Do you have questions or concerns about your Care? no  Actions: * If pain score is 4 or above: No action needed, pain <4.

## 2013-06-24 ENCOUNTER — Encounter: Payer: Self-pay | Admitting: Internal Medicine

## 2013-07-21 ENCOUNTER — Encounter: Payer: Self-pay | Admitting: Internal Medicine

## 2013-07-22 ENCOUNTER — Encounter: Payer: Self-pay | Admitting: Internal Medicine

## 2013-07-22 ENCOUNTER — Ambulatory Visit (INDEPENDENT_AMBULATORY_CARE_PROVIDER_SITE_OTHER): Payer: Medicaid Other | Admitting: Internal Medicine

## 2013-07-22 ENCOUNTER — Other Ambulatory Visit (INDEPENDENT_AMBULATORY_CARE_PROVIDER_SITE_OTHER): Payer: Medicaid Other

## 2013-07-22 VITALS — BP 104/50 | HR 92 | Ht 65.0 in | Wt 245.4 lb

## 2013-07-22 DIAGNOSIS — K5080 Crohn's disease of both small and large intestine without complications: Secondary | ICD-10-CM

## 2013-07-22 DIAGNOSIS — Z23 Encounter for immunization: Secondary | ICD-10-CM

## 2013-07-22 DIAGNOSIS — A630 Anogenital (venereal) warts: Secondary | ICD-10-CM | POA: Insufficient documentation

## 2013-07-22 DIAGNOSIS — K5090 Crohn's disease, unspecified, without complications: Secondary | ICD-10-CM

## 2013-07-22 LAB — CBC
HCT: 41.4 % (ref 36.0–46.0)
Hemoglobin: 13.4 g/dL (ref 12.0–15.0)
MCHC: 32.4 g/dL (ref 30.0–36.0)
MCV: 87.7 fl (ref 78.0–100.0)
Platelets: 342 10*3/uL (ref 150.0–400.0)
RBC: 4.72 Mil/uL (ref 3.87–5.11)
RDW: 20.4 % — AB (ref 11.5–14.6)
WBC: 6.6 10*3/uL (ref 4.5–10.5)

## 2013-07-22 LAB — FERRITIN: Ferritin: 41.9 ng/mL (ref 10.0–291.0)

## 2013-07-22 LAB — IBC PANEL
IRON: 52 ug/dL (ref 42–145)
SATURATION RATIOS: 12.2 % — AB (ref 20.0–50.0)
Transferrin: 305 mg/dL (ref 212.0–360.0)

## 2013-07-22 MED ORDER — BUDESONIDE 3 MG PO CP24: 9.0000 mg | ORAL_CAPSULE | Freq: Every day | ORAL | Status: DC

## 2013-07-22 NOTE — Progress Notes (Signed)
Subjective:    Patient ID: Julie Mcconnell, female    DOB: July 08, 1985, 27 y.o.   MRN: [id number]  HPI Julie Mcconnell is a 28 year old female with recent diagnosis of Crohn's disease who is seen for followup. She was initially seen to evaluate bloody diarrhea abdominal pain and abnormal GI imaging. She came for a colonoscopy which was performed on 06/13/2013.  This revealed mild ileitis and pancolitis. There was edema, erythema loss of vascularity and superficial ulcerations throughout the colon. Biopsies of the terminal ileum were consistent with chronic active ileitis with granuloma. There is also chronic active colitis from the right and left colon. There was a pale colored polyp approximately 15 mm in size seen at the dentate line. This was biopsied and consistent with condyloma acuminata without dysplasia.  Prednisone was started after an ER visit, before she met me and continued for about 2 weeks after her colonoscopy. She has tapered off of this medication entirely.  Today she reports she is feeling better. She still having somewhat frequent and loose stools. This is happening 3-4 times per day. Overall this is much better than prior to diagnosis. She's no longer having blood in her stool. Also no melena. She does still have occasional and now mild abdominal discomfort which is diffuse and worse with eating. Her appetite is decreased. No nausea or vomiting. She has had some recent lower back pain which radiates towards her legs. No urinary symptoms. Denies rash. She continues to smoke. No new joint pains. She has been avoiding NSAIDs  Review of Systems As per history of present illness, otherwise negative  Current Medications, Allergies, Past Medical History, Past Surgical History, Family History and Social History were reviewed in [employer] record.     Objective:   Physical Exam BP 104/50  Pulse 92  Ht 5' 5"  (1.651 m)  Wt 245 lb 6 oz (111.301 kg)  BMI 40.83  kg/m2  LMP 07/07/2013 Constitutional: Well-developed and well-nourished Though somewhat disheveled female in no acute distress  HEENT: Normocephalic and atraumatic. Oropharynx is clear and moist. No oropharyngeal exudate. Conjunctivae are normal.  No scleral icterus. Neck: Neck supple. Trachea midline. Cardiovascular: Normal rate, regular rhythm and intact distal pulses. No M/R/G Pulmonary/chest: Effort normal and breath sounds normal. No wheezing, rales or rhonchi. Abdominal: Soft, nontender, nondistended. Bowel sounds active throughout.  Extremities: no clubbing, cyanosis, or edema Lymphadenopathy: No cervical adenopathy noted. Neurological: Alert and oriented to person place and time. Skin: Skin is warm and dry. No rashes noted. Psychiatric: Normal mood and affect. Behavior is normal.  CBC    Component Value Date/Time   WBC 6.6 07/22/2013 1240   RBC 4.72 07/22/2013 1240   HGB 13.4 07/22/2013 1240   HCT 41.4 07/22/2013 1240   PLT 342.0 07/22/2013 1240   MCV 87.7 07/22/2013 1240   MCH 28.2 06/01/2013 1210   MCHC 32.4 07/22/2013 1240   RDW 20.4* 07/22/2013 1240    CMP     Component Value Date/Time   NA 141 06/01/2013 1210   K 3.4* 06/01/2013 1210   CL 99 06/01/2013 1210   CO2 26 06/01/2013 1210   GLUCOSE 99 06/01/2013 1210   BUN 3* 06/01/2013 1210   CREATININE 0.55 06/01/2013 1210   CALCIUM 9.4 06/01/2013 1210   PROT 8.1 06/01/2013 1210   ALBUMIN 3.6 06/01/2013 1210   AST 13 06/01/2013 1210   ALT 14 06/01/2013 1210   ALKPHOS 89 06/01/2013 1210   BILITOT 0.4 06/01/2013 1210  GFRNONAA >90 06/01/2013 1210   GFRAA >90 06/01/2013 1210   Iron/TIBC/Ferritin    Component Value Date/Time   IRON 52 07/22/2013 1240   FERRITIN 41.9 07/22/2013 1240       Assessment & Plan:  28 year old female with recent diagnosis of Crohn's disease who is seen for followup. She was initially seen to evaluate bloody diarrhea abdominal pain and abnormal GI imaging.\  1.  Ileocolonic Crohn's -- given the biopsy  results and the presence of granulomas in the terminal ileum, Crohn's is felt to be the likely diagnosis over alternative colitis. She has had a response to the prednisone, and we had a long discussion today regarding long-term treatment and maintenance of remission in Crohn's disease. We discussed the natural history and the fact that this is a chronic disease which can progress if not treated. We discussed initial and maintenance therapy including immunomodulators and biologics.  We also discussed the risks in great detail including the risk of infection (including reactivation of latent TB and underlying viral hepatitis), hepatotoxicity, leukopenia, pancreatitis, nausea, malignancy (specifically lymphoma), demyelinating disease, and even heart failure. I do think she is appropriate for biologic therapy given the activity of her disease. She will need a PPD and viral hepatitis testing. Flu vaccine and Pneumovax recommended and will be given today. --8 weeks of Entocort 9 mg daily and initiate Remicade induction therapy at 5 mg/kg, then every 8 weeks thereafter assuming negative PPD in bowel hepatitis studies --Avoid NSAIDs --Low-residue diet --Return in 8 weeks --Attempt to stop smoking, patient made aware of resources in this regard  2.  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 No dysplasia but lesion not sampled entirely. Will refer to Dr. Leighton Ruff for further evaluation and consideration of treatment

## 2013-07-22 NOTE — Patient Instructions (Addendum)
You have been given a separate informational sheet regarding your tobacco use, the importance of quitting and local resources to help you quit.  You have been given a PPD Pneumonavax and a flu shot today  Your physician has requested that you go to the basement for  lab work before leaving today.  Please stop back at our office to have your PPD read in 48 hours  You have been given into on Remicade.  Avoid Nsaids

## 2013-07-23 ENCOUNTER — Telehealth: Payer: Self-pay

## 2013-07-23 ENCOUNTER — Other Ambulatory Visit: Payer: Self-pay

## 2013-07-23 DIAGNOSIS — A630 Anogenital (venereal) warts: Secondary | ICD-10-CM

## 2013-07-23 DIAGNOSIS — K5090 Crohn's disease, unspecified, without complications: Secondary | ICD-10-CM

## 2013-07-23 LAB — HEPATITIS B SURFACE ANTIBODY,QUALITATIVE: Hep B S Ab: POSITIVE — AB

## 2013-07-23 LAB — HEPATITIS B CORE ANTIBODY, TOTAL: Hep B Core Total Ab: NONREACTIVE

## 2013-07-23 LAB — HEPATITIS C ANTIBODY: HCV AB: NEGATIVE

## 2013-07-23 LAB — HEPATITIS B SURFACE ANTIGEN: Hepatitis B Surface Ag: NEGATIVE

## 2013-07-23 NOTE — Telephone Encounter (Signed)
Per office note 07/22/13 patient to be referred to Dr. Marcello Moores at [facility].  I have attempted to make the referral, but she has medicaid and referral must come from them.  She does not have a copy of her medicaid card.  She will call me back once she gets a copy of the card and we can contact that MD for an authorization.  She understands that referral can't be made until she calls me back with the card information.  She will come tomorrow and have her PPD skin test read.

## 2013-07-25 LAB — TB SKIN TEST
Induration: 0 mm
TB Skin Test: NEGATIVE

## 2013-07-28 ENCOUNTER — Telehealth: Payer: Self-pay | Admitting: Gastroenterology

## 2013-07-28 NOTE — Telephone Encounter (Signed)
Spoke to pt. Told her we have set up her next 3 remicade infusions at [facility]. I gave her the dates and told her I was sending her a letter in the mail with the dates on it as well. Pt verbalized understanding.

## 2013-07-31 ENCOUNTER — Telehealth: Payer: Self-pay | Admitting: Internal Medicine

## 2013-07-31 DIAGNOSIS — K5090 Crohn's disease, unspecified, without complications: Secondary | ICD-10-CM

## 2013-07-31 MED ORDER — BUDESONIDE 3 MG PO CP24: 9.0000 mg | ORAL_CAPSULE | Freq: Every day | ORAL | Status: DC

## 2013-07-31 NOTE — Telephone Encounter (Signed)
Sent entecort to [facility] on [address]

## 2013-08-04 ENCOUNTER — Encounter (HOSPITAL_COMMUNITY): Payer: Self-pay

## 2013-08-04 ENCOUNTER — Encounter (HOSPITAL_COMMUNITY)
Admission: RE | Admit: 2013-08-04 | Discharge: 2013-08-04 | Disposition: A | Discharge status: Home / Self Care | Payer: Medicaid Other | Source: Ambulatory Visit | Attending: Internal Medicine | Admitting: Internal Medicine

## 2013-08-04 VITALS — BP 118/64 | HR 67 | Temp 98.4°F | Resp 16 | Ht 65.0 in | Wt 246.0 lb

## 2013-08-04 DIAGNOSIS — K5090 Crohn's disease, unspecified, without complications: Secondary | ICD-10-CM

## 2013-08-04 MED ORDER — DIPHENHYDRAMINE HCL 25 MG PO TABS
50.0000 mg | ORAL_TABLET | Freq: Every day | ORAL | Status: DC
  Administered 2013-08-04: 50 mg via ORAL
  Filled 2013-08-04 (×3): qty 2

## 2013-08-04 MED ORDER — ACETAMINOPHEN 325 MG PO TABS
650.0000 mg | ORAL_TABLET | Freq: Every day | ORAL | Status: DC
  Administered 2013-08-04: 650 mg via ORAL
  Filled 2013-08-04: qty 2

## 2013-08-04 MED ORDER — SODIUM CHLORIDE 0.9 % IV SOLN
5.0000 mg/kg | Freq: Once | INTRAVENOUS | Status: AC
  Administered 2013-08-04: 600 mg via INTRAVENOUS
  Filled 2013-08-04: qty 60

## 2013-08-04 MED ORDER — SODIUM CHLORIDE 0.9 % IV SOLN
INTRAVENOUS | Status: DC
  Administered 2013-08-04: 09:00:00 via INTRAVENOUS

## 2013-08-04 NOTE — Discharge Instructions (Signed)
Infliximab injection What is this medicine? INFLIXIMAB (in [place] i mab) is used to treat Crohn's disease and ulcerative colitis. It is also used to treat ankylosing spondylitis, psoriasis, and some forms of arthritis. This medicine may be used for other purposes; ask your health care provider or pharmacist if you have questions. COMMON BRAND NAME(S): Remicade What should I tell my health care provider before I take this medicine? They need to know if you have any of these conditions: -diabetes -exposure to tuberculosis -heart failure -hepatitis or liver disease -immune system problems -infection -lung or breathing disease, like COPD -multiple sclerosis -current or past resident of [place] or [place] -seizure disorder -an unusual or allergic reaction to infliximab, mouse proteins, other medicines, foods, dyes, or preservatives -pregnant or trying to get pregnant -breast-feeding How should I use this medicine? This medicine is for injection into a vein. It is usually given by a health care professional in a hospital or clinic setting. A special MedGuide will be given to you by the pharmacist with each prescription and refill. Be sure to read this information carefully each time. Talk to your pediatrician regarding the use of this medicine in children. Special care may be needed. Overdosage: If you think you have taken too much of this medicine contact a poison control center or emergency room at once. NOTE: This medicine is only for you. Do not share this medicine with others. What if I miss a dose? It is important not to miss your dose. Call your doctor or health care professional if you are unable to keep an appointment. What may interact with this medicine? Do not take this medicine with any of the following medications: -anakinra -rilonacept This medicine may also interact with the following medications: -vaccines This list may not describe all possible interactions.  Give your health care provider a list of all the medicines, herbs, non-prescription drugs, or dietary supplements you use. Also tell them if you smoke, drink alcohol, or use illegal drugs. Some items may interact with your medicine. What should I watch for while using this medicine? Visit your doctor or health care professional for regular checks on your progress. If you get a cold or other infection while receiving this medicine, call your doctor or health care professional. Do not treat yourself. This medicine may decrease your body's ability to fight infections. Before beginning therapy, your doctor may do a test to see if you have been exposed to tuberculosis. This medicine may make the symptoms of heart failure worse in some patients. If you notice symptoms such as increased shortness of breath or swelling of the ankles or legs, contact your health care provider right away. If you are going to have surgery or dental work, tell your health care professional or dentist that you have received this medicine. If you take this medicine for plaque psoriasis, stay out of the sun. If you cannot avoid being in the sun, wear protective clothing and use sunscreen. Do not use sun lamps or tanning beds/booths. What side effects may I notice from receiving this medicine? Side effects that you should report to your doctor or health care professional as soon as possible: -allergic reactions like skin rash, itching or hives, swelling of the face, lips, or tongue -chest pain -fever or chills, usually related to the infusion -muscle or joint pain -red, scaly patches or raised bumps on the skin -signs of infection - fever or chills, cough, sore throat, pain or difficulty passing urine -swollen lymph nodes  in the neck, underarm, or groin areas -unexplained weight loss -unusual bleeding or bruising -unusually weak or tired -yellowing of the eyes or skin Side effects that usually do not require medical attention  (report to your doctor or health care professional if they continue or are bothersome): -headache -heartburn or stomach pain -nausea, vomiting This list may not describe all possible side effects. Call your doctor for medical advice about side effects. You may report side effects to FDA at 1-800-FDA-1088. Where should I keep my medicine? This drug is given in a hospital or clinic and will not be stored at home. NOTE: This sheet is a summary. It may not cover all possible information. If you have questions about this medicine, talk to your doctor, pharmacist, or health care provider.  2014, Elsevier/Gold Standard. (2007-12-18 10:26:02)

## 2013-08-05 ENCOUNTER — Ambulatory Visit (INDEPENDENT_AMBULATORY_CARE_PROVIDER_SITE_OTHER): Payer: Medicaid Other | Admitting: General Surgery

## 2013-08-18 ENCOUNTER — Encounter (HOSPITAL_COMMUNITY): Payer: Self-pay

## 2013-08-18 ENCOUNTER — Encounter (HOSPITAL_COMMUNITY)
Admission: RE | Admit: 2013-08-18 | Discharge: 2013-08-18 | Disposition: A | Discharge status: Home / Self Care | Payer: Medicaid Other | Source: Ambulatory Visit | Attending: Internal Medicine | Admitting: Internal Medicine

## 2013-08-18 VITALS — BP 123/75 | HR 68 | Temp 98.2°F | Resp 16 | Ht 65.0 in | Wt 250.1 lb

## 2013-08-18 DIAGNOSIS — K5090 Crohn's disease, unspecified, without complications: Secondary | ICD-10-CM | POA: Insufficient documentation

## 2013-08-18 MED ORDER — DIPHENHYDRAMINE HCL 25 MG PO TABS
50.0000 mg | ORAL_TABLET | Freq: Once | ORAL | Status: DC
  Filled 2013-08-18: qty 2

## 2013-08-18 MED ORDER — SODIUM CHLORIDE 0.9 % IV SOLN
INTRAVENOUS | Status: DC
  Administered 2013-08-18: 250 mL via INTRAVENOUS

## 2013-08-18 MED ORDER — ACETAMINOPHEN 325 MG PO TABS
650.0000 mg | ORAL_TABLET | Freq: Once | ORAL | Status: AC
  Administered 2013-08-18: 650 mg via ORAL
  Filled 2013-08-18: qty 2

## 2013-08-18 MED ORDER — SODIUM CHLORIDE 0.9 % IV SOLN
5.0000 mg/kg | Freq: Once | INTRAVENOUS | Status: AC
  Administered 2013-08-18: 600 mg via INTRAVENOUS
  Filled 2013-08-18: qty 60

## 2013-08-18 MED ORDER — DIPHENHYDRAMINE HCL 25 MG PO CAPS
50.0000 mg | ORAL_CAPSULE | Freq: Once | ORAL | Status: AC
  Administered 2013-08-18: 50 mg via ORAL
  Filled 2013-08-18: qty 2

## 2013-08-21 ENCOUNTER — Ambulatory Visit (INDEPENDENT_AMBULATORY_CARE_PROVIDER_SITE_OTHER): Payer: Medicaid Other | Admitting: General Surgery

## 2013-08-21 ENCOUNTER — Encounter (INDEPENDENT_AMBULATORY_CARE_PROVIDER_SITE_OTHER): Payer: Self-pay | Admitting: General Surgery

## 2013-08-21 VITALS — BP 118/78 | HR 70 | Temp 98.7°F | Resp 16 | Ht 65.5 in | Wt 250.0 lb

## 2013-08-21 DIAGNOSIS — A630 Anogenital (venereal) warts: Secondary | ICD-10-CM

## 2013-08-21 NOTE — Progress Notes (Signed)
Chief Complaint  Patient presents with  . New Evaluation    eval anal condy    HISTORY: Julie Mcconnell is a 28 y.o. female who presents to the office with perianal pain.  Other symptoms include itching, burning.  This had been occurring for 2 months.   Her bowel habits are regular and her bowel movements are twice a day and soft.  She denies any bloody stools.   Her last colonoscopy was Jun 13, 2013 where a lesion was noted and biopsied.  This showed condyloma.  Of note the patient had inflammation throughout her colon c/w Crohn's disease and has recently been started on Remicade.    Past Medical History  Diagnosis Date  . No pertinent past medical history   . Colitis 06/13/2013    Dr. Hilarie Fredrickson  . Bronchitis     flares up on occasion but nothing currently      Past Surgical History  Procedure Laterality Date  . Cesarean section    . Tonsillectomy    . Hernia repair      umbilical hernia repair  . Inner ear surgery Right     as a child        Current Outpatient Prescriptions  Medication Sig Dispense Refill  . C1 Esterase Inhibitor, Recomb, (RUCONEST) 2100 UNITS SOLR Inject into the vein.      . budesonide (ENTOCORT EC) 3 MG 24 hr capsule Take 3 capsules (9 mg total) by mouth daily.  90 capsule  0   No current facility-administered medications for this visit.      No Known Allergies    Family History  Problem Relation Age of Onset  . Heart disease Maternal Grandmother     History   Social History  . Marital Status: Single    Spouse Name: N/A    Number of Children: N/A  . Years of Education: N/A   Social History Main Topics  . Smoking status: Current Every Day Smoker -- 1.50 packs/day for 6 years    Types: Cigarettes  . Smokeless tobacco: Never Used  . Alcohol Use: 0.6 oz/week    1 Cans of beer per week  . Drug Use: No  . Sexual Activity: Yes   Other Topics Concern  . None   Social History Narrative  . None      REVIEW OF SYSTEMS - PERTINENT POSITIVES  ONLY: Review of Systems - General ROS: negative for - chills, fever or weight loss Hematological and Lymphatic ROS: negative for - bleeding problems, blood clots or bruising Respiratory ROS: no cough, shortness of breath, or wheezing Cardiovascular ROS: no chest pain or dyspnea on exertion Gastrointestinal ROS: no abdominal pain, change in bowel habits, or black or bloody stools Genito-Urinary ROS: no dysuria, trouble voiding, or hematuria  EXAM: Filed Vitals:   08/21/13 1557  BP: 118/78  Pulse: 70  Temp: 98.7 F (37.1 C)  Resp: 16    General appearance: alert and cooperative Resp: clear to auscultation bilaterally Cardio: regular rate and rhythm GI: normal findings: soft, non-tender   Procedure: Anoscopy Surgeon: Marcello Moores Diagnosis: anal condyloma  Assistant: Jeralyn Ruths After the risks and benefits were explained, verbal consent was obtained for above procedure  Anesthesia: none Findings: internal lesion not definitively seen, external lesion noted    ASSESSMENT AND PLAN: Julie Mcconnell Is a 28 year old female who presents to the office 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 She has started Remicade for treatment of a newly  diagnosed Crohn's disease. I think given her new Immunosuppression, we should investigate further with anal exam under anesthesia and laser ablation of any anal condyloma noted. We have discussed the typical postoperative recovery for this surgery. Main risk of the procedure would be bleeding, pain and recurrence. We discussed the management of anal warts. We discussed chemical destruction, immunotherapy, and surgical excision. I discussed the pros and cons of each approach. We discussed the risk and benefits and the expected outcome with chemical destruction with agents such as podophyllin. I explained that podophyllin is generally not been effective and has a high recurrence rate. We discussed the use of Aldara ointment. I  explained that it has a 30-70% chance at resolving or at least reducing the number of anal warts. I explained that it is applied 3 times a week at night and left on overnight. I explained that skin irritation is the most common side effect. We then discussed surgical excision specifically excision and fulguration. I explained how the surgery is performed. I explained that it can be painful however it generally has the highest success rate. We discussed the risk and benefits of surgery including but not limited to bleeding, infection, injury to surrounding structures, need to do a formal anoscopic exam to evaluate for anal canal warts, urinary retention, wart recurrence, and general anesthesia risk. We discussed the typical aftercare.      Rosario Adie, MD Colon and Rectal Surgery / [facility]      Visit Diagnoses: No diagnosis found.  Primary Care Physician: No PCP Per Patient

## 2013-08-21 NOTE — Patient Instructions (Signed)
We will schedule you for outpatient surgery to remove your anal condyloma.You do not need to do a bowel prep before surgery.

## 2013-08-22 ENCOUNTER — Encounter (HOSPITAL_BASED_OUTPATIENT_CLINIC_OR_DEPARTMENT_OTHER): Payer: Self-pay | Admitting: *Deleted

## 2013-08-22 NOTE — Progress Notes (Addendum)
NPO AFTER MN.  NEEDS HG AND URINE PREG.

## 2013-08-25 ENCOUNTER — Encounter (HOSPITAL_BASED_OUTPATIENT_CLINIC_OR_DEPARTMENT_OTHER): Payer: Self-pay | Admitting: *Deleted

## 2013-08-25 ENCOUNTER — Encounter (HOSPITAL_BASED_OUTPATIENT_CLINIC_OR_DEPARTMENT_OTHER): Payer: Medicaid Other | Admitting: Anesthesiology

## 2013-08-25 ENCOUNTER — Ambulatory Visit (HOSPITAL_BASED_OUTPATIENT_CLINIC_OR_DEPARTMENT_OTHER): Payer: Medicaid Other | Admitting: Anesthesiology

## 2013-08-25 ENCOUNTER — Ambulatory Visit (HOSPITAL_BASED_OUTPATIENT_CLINIC_OR_DEPARTMENT_OTHER)
Admission: RE | Admit: 2013-08-25 | Discharge: 2013-08-25 | Disposition: A | Discharge status: Home / Self Care | Payer: Medicaid Other | Source: Ambulatory Visit | Attending: General Surgery | Admitting: General Surgery

## 2013-08-25 ENCOUNTER — Encounter (HOSPITAL_BASED_OUTPATIENT_CLINIC_OR_DEPARTMENT_OTHER)
Admission: RE | Disposition: A | Discharge status: Home / Self Care | Payer: Self-pay | Source: Ambulatory Visit | Attending: General Surgery

## 2013-08-25 DIAGNOSIS — H5710 Ocular pain, unspecified eye: Secondary | ICD-10-CM | POA: Insufficient documentation

## 2013-08-25 DIAGNOSIS — K5010 Crohn's disease of large intestine without complications: Secondary | ICD-10-CM | POA: Insufficient documentation

## 2013-08-25 DIAGNOSIS — K6289 Other specified diseases of anus and rectum: Secondary | ICD-10-CM | POA: Insufficient documentation

## 2013-08-25 DIAGNOSIS — K219 Gastro-esophageal reflux disease without esophagitis: Secondary | ICD-10-CM | POA: Insufficient documentation

## 2013-08-25 DIAGNOSIS — Z79899 Other long term (current) drug therapy: Secondary | ICD-10-CM | POA: Insufficient documentation

## 2013-08-25 DIAGNOSIS — F17200 Nicotine dependence, unspecified, uncomplicated: Secondary | ICD-10-CM | POA: Insufficient documentation

## 2013-08-25 DIAGNOSIS — A630 Anogenital (venereal) warts: Secondary | ICD-10-CM | POA: Insufficient documentation

## 2013-08-25 DIAGNOSIS — D649 Anemia, unspecified: Secondary | ICD-10-CM | POA: Insufficient documentation

## 2013-08-25 HISTORY — DX: Personal history of other diseases of the respiratory system: Z87.09

## 2013-08-25 HISTORY — DX: Gastro-esophageal reflux disease without esophagitis: K21.9

## 2013-08-25 HISTORY — DX: Personal history of traumatic brain injury: Z87.820

## 2013-08-25 HISTORY — PX: LASER ABLATION CONDOLAMATA: SHX5941

## 2013-08-25 LAB — POCT PREGNANCY, URINE: PREG TEST UR: NEGATIVE

## 2013-08-25 LAB — POCT HEMOGLOBIN-HEMACUE: Hemoglobin: 13.3 g/dL (ref 12.0–15.0)

## 2013-08-25 SURGERY — ABLATION, CONDYLOMA, USING LASER
Anesthesia: Monitor Anesthesia Care | Site: Anus

## 2013-08-25 MED ORDER — FENTANYL CITRATE 0.05 MG/ML IJ SOLN
INTRAMUSCULAR | Status: DC | PRN
  Administered 2013-08-25: 25 ug via INTRAVENOUS

## 2013-08-25 MED ORDER — LIDOCAINE 5 % EX OINT
TOPICAL_OINTMENT | CUTANEOUS | Status: DC | PRN
  Administered 2013-08-25: 1 via TOPICAL

## 2013-08-25 MED ORDER — FENTANYL CITRATE 0.05 MG/ML IJ SOLN
INTRAMUSCULAR | Status: AC
  Filled 2013-08-25: qty 2

## 2013-08-25 MED ORDER — OXYCODONE HCL 5 MG PO TABS: 5.0000 mg | ORAL_TABLET | Freq: Four times a day (QID) | ORAL | Status: DC | PRN

## 2013-08-25 MED ORDER — OXYCODONE HCL 5 MG PO TABS
5.0000 mg | ORAL_TABLET | Freq: Once | ORAL | Status: DC | PRN
  Filled 2013-08-25: qty 1

## 2013-08-25 MED ORDER — LIDOCAINE HCL (CARDIAC) 20 MG/ML IV SOLN
INTRAVENOUS | Status: DC | PRN
  Administered 2013-08-25: 60 mg via INTRAVENOUS

## 2013-08-25 MED ORDER — MIDAZOLAM HCL 2 MG/2ML IJ SOLN
INTRAMUSCULAR | Status: AC
  Filled 2013-08-25: qty 2

## 2013-08-25 MED ORDER — MEPERIDINE HCL 25 MG/ML IJ SOLN
6.2500 mg | INTRAMUSCULAR | Status: DC | PRN
  Filled 2013-08-25: qty 1

## 2013-08-25 MED ORDER — LACTATED RINGERS IV SOLN
INTRAVENOUS | Status: DC
  Administered 2013-08-25: 11:00:00 via INTRAVENOUS
  Filled 2013-08-25: qty 1000

## 2013-08-25 MED ORDER — OFLOXACIN 0.3 % OP SOLN
1.0000 [drp] | Freq: Four times a day (QID) | OPHTHALMIC | Status: DC
  Administered 2013-08-25: 1 [drp] via OPHTHALMIC
  Filled 2013-08-25 (×2): qty 5

## 2013-08-25 MED ORDER — OXYCODONE HCL 5 MG PO TABS
5.0000 mg | ORAL_TABLET | ORAL | Status: DC | PRN
  Filled 2013-08-25: qty 2

## 2013-08-25 MED ORDER — SODIUM CHLORIDE 0.9 % IV SOLN
250.0000 mL | INTRAVENOUS | Status: DC | PRN
  Filled 2013-08-25: qty 250

## 2013-08-25 MED ORDER — PROMETHAZINE HCL 25 MG/ML IJ SOLN
6.2500 mg | INTRAMUSCULAR | Status: DC | PRN
Start: 2013-08-25 — End: 2013-08-25
  Filled 2013-08-25: qty 1

## 2013-08-25 MED ORDER — KETAMINE HCL 10 MG/ML IJ SOLN
INTRAMUSCULAR | Status: DC | PRN
  Administered 2013-08-25: 10 mg via INTRAVENOUS

## 2013-08-25 MED ORDER — PROPOFOL INFUSION 10 MG/ML OPTIME
INTRAVENOUS | Status: DC | PRN
  Administered 2013-08-25: 100 ug/kg/min via INTRAVENOUS

## 2013-08-25 MED ORDER — MIDAZOLAM HCL 5 MG/5ML IJ SOLN
INTRAMUSCULAR | Status: DC | PRN
  Administered 2013-08-25 (×2): 2 mg via INTRAVENOUS

## 2013-08-25 MED ORDER — BUPIVACAINE-EPINEPHRINE 0.5% -1:200000 IJ SOLN
INTRAMUSCULAR | Status: DC | PRN
  Administered 2013-08-25: 20 mL

## 2013-08-25 MED ORDER — OXYCODONE HCL 5 MG/5ML PO SOLN
5.0000 mg | Freq: Once | ORAL | Status: DC | PRN
  Filled 2013-08-25: qty 5

## 2013-08-25 MED ORDER — SODIUM CHLORIDE 0.9 % IJ SOLN
3.0000 mL | INTRAMUSCULAR | Status: DC | PRN
  Filled 2013-08-25: qty 3

## 2013-08-25 MED ORDER — DEXAMETHASONE SODIUM PHOSPHATE 4 MG/ML IJ SOLN
INTRAMUSCULAR | Status: DC | PRN
  Administered 2013-08-25: 8 mg via INTRAVENOUS

## 2013-08-25 MED ORDER — PROPOFOL 10 MG/ML IV BOLUS
INTRAVENOUS | Status: DC | PRN
  Administered 2013-08-25: 50 mg via INTRAVENOUS
  Administered 2013-08-25: 20 mg via INTRAVENOUS

## 2013-08-25 MED ORDER — ACETIC ACID 5 % SOLN
Status: DC | PRN
  Administered 2013-08-25: 1 via TOPICAL

## 2013-08-25 MED ORDER — ONDANSETRON HCL 4 MG/2ML IJ SOLN
INTRAMUSCULAR | Status: DC | PRN
  Administered 2013-08-25: 4 mg via INTRAVENOUS

## 2013-08-25 MED ORDER — KETAMINE HCL 10 MG/ML IJ SOLN
INTRAMUSCULAR | Status: AC
  Filled 2013-08-25: qty 1

## 2013-08-25 MED ORDER — ACETAMINOPHEN 325 MG PO TABS
650.0000 mg | ORAL_TABLET | ORAL | Status: DC | PRN
  Filled 2013-08-25: qty 2

## 2013-08-25 MED ORDER — HYDROMORPHONE HCL PF 1 MG/ML IJ SOLN
0.2500 mg | INTRAMUSCULAR | Status: DC | PRN
  Filled 2013-08-25: qty 1

## 2013-08-25 MED ORDER — KETAMINE HCL 50 MG/ML IJ SOLN
INTRAMUSCULAR | Status: AC
  Filled 2013-08-25: qty 10

## 2013-08-25 MED ORDER — ACETAMINOPHEN 650 MG RE SUPP
650.0000 mg | RECTAL | Status: DC | PRN
Start: 2013-08-25 — End: 2013-08-25
  Filled 2013-08-25: qty 1

## 2013-08-25 MED ORDER — SODIUM CHLORIDE 0.9 % IJ SOLN
3.0000 mL | Freq: Two times a day (BID) | INTRAMUSCULAR | Status: DC
  Filled 2013-08-25: qty 3

## 2013-08-25 SURGICAL SUPPLY — 49 items
BLADE HEX COATED 2.75 (ELECTRODE) ×3 IMPLANT
BLADE SURG 15 STRL LF DISP TIS (BLADE) ×1 IMPLANT
BLADE SURG 15 STRL SS (BLADE) ×3
BRIEF STRETCH FOR OB PAD LRG (UNDERPADS AND DIAPERS) ×3 IMPLANT
CANISTER SUCTION 2500CC (MISCELLANEOUS) ×3 IMPLANT
CLOTH BEACON ORANGE TIMEOUT ST (SAFETY) ×2 IMPLANT
COVER MAYO STAND STRL (DRAPES) ×3 IMPLANT
COVER TABLE BACK 60X90 (DRAPES) ×3 IMPLANT
DECANTER SPIKE VIAL GLASS SM (MISCELLANEOUS) ×3 IMPLANT
DRAPE LG THREE QUARTER DISP (DRAPES) IMPLANT
DRAPE PED LAPAROTOMY (DRAPES) ×3 IMPLANT
DRSG PAD ABDOMINAL 8X10 ST (GAUZE/BANDAGES/DRESSINGS) ×2 IMPLANT
ELECT REM PT RETURN 9FT ADLT (ELECTROSURGICAL) ×3
ELECTRODE REM PT RTRN 9FT ADLT (ELECTROSURGICAL) ×1 IMPLANT
GAUZE SPONGE 4X4 16PLY XRAY LF (GAUZE/BANDAGES/DRESSINGS) ×2 IMPLANT
GAUZE VASELINE 3X9 (GAUZE/BANDAGES/DRESSINGS) ×2 IMPLANT
GLOVE BIO SURGEON STRL SZ 6.5 (GLOVE) ×2 IMPLANT
GLOVE BIO SURGEONS STRL SZ 6.5 (GLOVE) ×1
GLOVE BIOGEL PI IND STRL 7.0 (GLOVE) ×1 IMPLANT
GLOVE BIOGEL PI INDICATOR 7.0 (GLOVE) ×2
GOWN PREVENTION PLUS LG XLONG (DISPOSABLE) ×3 IMPLANT
GOWN PREVENTION PLUS XLARGE (GOWN DISPOSABLE) ×1 IMPLANT
GOWN STRL REIN XL XLG (GOWN DISPOSABLE) ×1 IMPLANT
GOWN STRL REUS W/ TWL LRG LVL3 (GOWN DISPOSABLE) IMPLANT
GOWN STRL REUS W/TWL LRG LVL3 (GOWN DISPOSABLE) ×3
NDL HYPO 25X1 1.5 SAFETY (NEEDLE) ×1 IMPLANT
NDL SAFETY ECLIPSE 18X1.5 (NEEDLE) IMPLANT
NEEDLE HYPO 18GX1.5 SHARP (NEEDLE)
NEEDLE HYPO 25X1 1.5 SAFETY (NEEDLE) ×3 IMPLANT
NS IRRIG 500ML POUR BTL (IV SOLUTION) ×3 IMPLANT
PACK BASIN DAY SURGERY FS (CUSTOM PROCEDURE TRAY) ×3 IMPLANT
PAD ABD 8X10 STRL (GAUZE/BANDAGES/DRESSINGS) ×3 IMPLANT
PENCIL BUTTON HOLSTER BLD 10FT (ELECTRODE) ×3 IMPLANT
SPONGE GAUZE 4X4 12PLY (GAUZE/BANDAGES/DRESSINGS) IMPLANT
SPONGE GAUZE 4X4 12PLY STER LF (GAUZE/BANDAGES/DRESSINGS) ×2 IMPLANT
SPONGE SURGIFOAM ABS GEL 12-7 (HEMOSTASIS) ×2 IMPLANT
SUT CHROMIC 2 0 SH (SUTURE) ×2 IMPLANT
SUT CHROMIC 3 0 SH 27 (SUTURE) IMPLANT
SUT MON AB 3-0 SH 27 (SUTURE)
SUT MON AB 3-0 SH27 (SUTURE) IMPLANT
SUT VIC AB 4-0 P-3 18XBRD (SUTURE) IMPLANT
SUT VIC AB 4-0 P3 18 (SUTURE)
SYR CONTROL 10ML LL (SYRINGE) ×3 IMPLANT
TOWEL OR 17X24 6PK STRL BLUE (TOWEL DISPOSABLE) ×6 IMPLANT
TRAY DSU PREP LF (CUSTOM PROCEDURE TRAY) ×3 IMPLANT
TUBE CONNECTING 12'X1/4 (SUCTIONS) ×1
TUBE CONNECTING 12X1/4 (SUCTIONS) ×2 IMPLANT
VACUUM HOSE 7/8X10 W/ WAND (MISCELLANEOUS) ×3 IMPLANT
YANKAUER SUCT BULB TIP NO VENT (SUCTIONS) ×3 IMPLANT

## 2013-08-25 NOTE — H&P (View-Only) (Signed)
Chief Complaint  Patient presents with  . New Evaluation    eval anal condy    HISTORY: Julie Mcconnell is a 28 y.o. female who presents to the office with perianal pain.  Other symptoms include itching, burning.  This had been occurring for 2 months.   Her bowel habits are regular and her bowel movements are twice a day and soft.  She denies any bloody stools.   Her last colonoscopy was Jun 13, 2013 where a lesion was noted and biopsied.  This showed condyloma.  Of note the patient had inflammation throughout her colon c/w Crohn's disease and has recently been started on Remicade.    Past Medical History  Diagnosis Date  . No pertinent past medical history   . Colitis 06/13/2013    Dr. Hilarie Fredrickson  . Bronchitis     flares up on occasion but nothing currently      Past Surgical History  Procedure Laterality Date  . Cesarean section    . Tonsillectomy    . Hernia repair      umbilical hernia repair  . Inner ear surgery Right     as a child        Current Outpatient Prescriptions  Medication Sig Dispense Refill  . C1 Esterase Inhibitor, Recomb, (RUCONEST) 2100 UNITS SOLR Inject into the vein.      . budesonide (ENTOCORT EC) 3 MG 24 hr capsule Take 3 capsules (9 mg total) by mouth daily.  90 capsule  0   No current facility-administered medications for this visit.      No Known Allergies    Family History  Problem Relation Age of Onset  . Heart disease Maternal Grandmother     History   Social History  . Marital Status: Single    Spouse Name: N/A    Number of Children: N/A  . Years of Education: N/A   Social History Main Topics  . Smoking status: Current Every Day Smoker -- 1.50 packs/day for 6 years    Types: Cigarettes  . Smokeless tobacco: Never Used  . Alcohol Use: 0.6 oz/week    1 Cans of beer per week  . Drug Use: No  . Sexual Activity: Yes   Other Topics Concern  . None   Social History Narrative  . None      REVIEW OF SYSTEMS - PERTINENT POSITIVES  ONLY: Review of Systems - General ROS: negative for - chills, fever or weight loss Hematological and Lymphatic ROS: negative for - bleeding problems, blood clots or bruising Respiratory ROS: no cough, shortness of breath, or wheezing Cardiovascular ROS: no chest pain or dyspnea on exertion Gastrointestinal ROS: no abdominal pain, change in bowel habits, or black or bloody stools Genito-Urinary ROS: no dysuria, trouble voiding, or hematuria  EXAM: Filed Vitals:   08/21/13 1557  BP: 118/78  Pulse: 70  Temp: 98.7 F (37.1 C)  Resp: 16    General appearance: alert and cooperative Resp: clear to auscultation bilaterally Cardio: regular rate and rhythm GI: normal findings: soft, non-tender   Procedure: Anoscopy Surgeon: Marcello Moores Diagnosis: anal condyloma  Assistant: Jeralyn Ruths After the risks and benefits were explained, verbal consent was obtained for above procedure  Anesthesia: none Findings: internal lesion not definitively seen, external lesion noted    ASSESSMENT AND PLAN: Julie Mcconnell Is a 28 year old female who presents to the office 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 She has started Remicade for treatment of a newly  diagnosed Crohn's disease. I think given her new Immunosuppression, we should investigate further with anal exam under anesthesia and laser ablation of any anal condyloma noted. We have discussed the typical postoperative recovery for this surgery. Main risk of the procedure would be bleeding, pain and recurrence. We discussed the management of anal warts. We discussed chemical destruction, immunotherapy, and surgical excision. I discussed the pros and cons of each approach. We discussed the risk and benefits and the expected outcome with chemical destruction with agents such as podophyllin. I explained that podophyllin is generally not been effective and has a high recurrence rate. We discussed the use of Aldara ointment. I  explained that it has a 30-70% chance at resolving or at least reducing the number of anal warts. I explained that it is applied 3 times a week at night and left on overnight. I explained that skin irritation is the most common side effect. We then discussed surgical excision specifically excision and fulguration. I explained how the surgery is performed. I explained that it can be painful however it generally has the highest success rate. We discussed the risk and benefits of surgery including but not limited to bleeding, infection, injury to surrounding structures, need to do a formal anoscopic exam to evaluate for anal canal warts, urinary retention, wart recurrence, and general anesthesia risk. We discussed the typical aftercare.      Rosario Adie, MD Colon and Rectal Surgery / [facility]      Visit Diagnoses: No diagnosis found.  Primary Care Physician: No PCP Per Patient

## 2013-08-25 NOTE — Progress Notes (Signed)
I was called to see Julie Mcconnell because she is complaining of left eye burning after MAC anesthesia for rectal procedure.  She has recently had right eye draining from probable allergic symptoms. No complaints of left eye problems until after procedure today. No eye problems other than allergic symptoms and no regular eye physician.  On exam, left eye is tearing and conjunctiva is red. She states vision is OK. She denies specific sensation of foreign body in eye. On exam, no foreign body seen.  Discussed with patient options of sending her to the ER or treating her with antibiotic eye drops with expectation that symptoms will improve in 24 hours.  She elects eye drops and expectant therapy. The nurse will call her tomorrow and she will call Dr. Rachael Fee office if necessary.  Ocuflox eye drops ordered for left eye QID.  Dr. Lissa Hoard aware.

## 2013-08-25 NOTE — Anesthesia Preprocedure Evaluation (Addendum)
Anesthesia Evaluation  Patient identified by MRN, date of birth, ID band Patient awake    Reviewed: Allergy & Precautions, H&P , NPO status , Patient's Chart, lab work & pertinent test results  Airway Mallampati: III TM Distance: >3 FB Neck ROM: full    Dental  (+) Teeth Intact   Pulmonary Current Smoker,  breath sounds clear to auscultation        Cardiovascular negative cardio ROS  Rhythm:regular Rate:Normal     Neuro/Psych negative neurological ROS  negative psych ROS   GI/Hepatic Neg liver ROS, GERD-  ,  Endo/Other  Morbid obesity  Renal/GU negative Renal ROS     Musculoskeletal negative musculoskeletal ROS (+)   Abdominal   Peds  Hematology negative hematology ROS (+) anemia ,   Anesthesia Other Findings   Reproductive/Obstetrics negative OB ROS                          Anesthesia Physical  Anesthesia Plan  ASA: III  Anesthesia Plan: MAC   Post-op Pain Management:    Induction: Intravenous  Airway Management Planned: Simple Face Mask and Natural Airway  Additional Equipment:   Intra-op Plan:   Post-operative Plan:   Informed Consent: I have reviewed the patients History and Physical, chart, labs and discussed the procedure including the risks, benefits and alternatives for the proposed anesthesia with the patient or authorized representative who has indicated his/her understanding and acceptance.   Dental advisory given  Plan Discussed with: CRNA  Anesthesia Plan Comments:        Anesthesia Quick Evaluation

## 2013-08-25 NOTE — Op Note (Signed)
08/25/2013  12:47 PM  PATIENT:  Julie Mcconnell  28 y.o. female  Patient Care Team: No Pcp Per Patient as PCP - General (General Practice)  PRE-OPERATIVE DIAGNOSIS:  anal condyloma  POST-OPERATIVE DIAGNOSIS:  anal condyloma  PROCEDURE:  LASER ABLATION CONDOLAMATA/ANAL EXAM UNDER ANESTHESIA  SURGEON:  Surgeon(s): Leighton Ruff, MD  ASSISTANT: none   ANESTHESIA:   local and MAC  EBL: min     SPECIMEN:  Source of Specimen:  posterior anal canal  DISPOSITION OF SPECIMEN:  PATHOLOGY  COUNTS:  YES  PLAN OF CARE: Discharge to home after PACU  PATIENT DISPOSITION:  PACU - hemodynamically stable.  INDICATION: 28 y.o. F who recently underwent colonoscopy for diarrhea and was found to have Crohn's disease and a posterior anal canal lesion that was biopsied and found to be condyloma.  OR FINDINGS: posterior lesion with raised border on left side (biopsied)  DESCRIPTION: The patient was identified in the preoperative holding area and taken to the OR where they were laid prone on the operating room table in jack knife position. MAC anesthesia was smoothly induced.  The patient was then prepped and draped in the usual sterile fashion. A surgical timeout was performed indicating the correct patient, procedure, positioning and preoperative antibioitics. SCDs were noted to be in place and functioning prior to the operation. A rectal block was completed using Marcaine with epinephrine.    After this was completed, a sponge was soaked in 5% acetic acid was placed over the perianal region. This was allowed to soak for 2 minutes. The sponge was removed and the perianal region was inspected.  There was one anterior perianal lesion noted.  The internal anal canal was evaluated via anoscopy with a Hill-Ferguson anoscope.  There were several small lesions in the R and L lateral canal and the large lesion posteriorly.  This was biopsied sharply.  After this was completed, hemostasis was achieved with  electrocautery.  Next the laser was brought onto the field.  The edges of the operative field were draped with wet towels.  Appropriate ventilation was obtained.  All staff were protected with small particle masks and goggles safe for the laser.  The laser was then activated. All condylomatous lesions were ablated. Hemostasis was then achieved using electrocautery. A marcaine soaked piece of gelfoam was inserted into the anal canal to help with bleeding.  A sterile dressing was applied over this. The patient was then awakened from anesthesia and sent to the postanesthesia care unit in stable condition. All counts were correct operating room staff.

## 2013-08-25 NOTE — Transfer of Care (Addendum)
Immediate Anesthesia Transfer of Care Note  Patient: Julie Mcconnell  Procedure(s) Performed: Procedure(s) (LRB): LASER ABLATION CONDOLAMATA/ANAL EXAM UNDER ANESTHESIA (N/A)  Patient Location: PACU  Anesthesia Type: MAC  Level of Consciousness: awake, alert  and oriented  Airway & Oxygen Therapy: Patient Spontanous Breathing and Patient connected to nasal cannula oxygen  Post-op Assessment: Report given to PACU RN and Post -op Vital signs reviewed and stable  Post vital signs: Reviewed and stable  Complications: No apparent anesthesia complications

## 2013-08-25 NOTE — Interval H&P Note (Signed)
History and Physical Interval Note:  08/25/2013 11:40 AM  Julie Mcconnell  has presented today for surgery, with the diagnosis of anal condyloma  The various methods of treatment have been discussed with the patient and family. After consideration of risks, benefits and other options for treatment, the patient has consented to  Procedure(s): LASER ABLATION CONDOLAMATA/ANAL EXAM UNDER ANESTHESIA (N/A) as a surgical intervention .  The patient's history has been reviewed, patient examined, no change in status, stable for surgery.  I have reviewed the patient's chart and labs.  Questions were answered to the patient's satisfaction.     Rosario Adie, MD  [facility]

## 2013-08-25 NOTE — OR Nursing (Signed)
In PACU complaining of left eye burning real bad; eye is reddened. States she has allergies but eye has never felt like this. Cool cloth to left eye and saline flush with no relief. Dr Lissa Hoard made aware and Dr. Delma Post to see patient.

## 2013-08-25 NOTE — Discharge Instructions (Addendum)
Post Operative Instructions Following Laser Surgery  Laser treatment of condyloma (warts) is used to vaporize or eliminate the wart.  The laser actually creates a burn effect on the skin to accomplish this.  The following instructions will help in you comfort postoperatively:  First 24 h Remove the dressing this evening after you return home from the hospital Sit in a tub or sitz bath of COOL water for 20 minutes every hour while awake.  After the bath, carefully blot the area dry and place a piece of 100% Cotton with Silvadene on it next to the anal opening.  You may sit on a covered ice pack between the baths as needed.   You should have crushed ice in small amounts until you are able to urinate.  After you urinate, you may drink fluids.  It is normal to not urinate for the first few hours after surgery.  If you become uncomfortable, call the office for instructions.  Take your pain medications as prescribed You may eat whatever you feel like.  Start with a light meal and gradually advance your diet.    Beginning the day after your surgery Sit in a tub of cool to warm water for 15 minutes at least 3 times a day and after bowel movements After the bowel movement, clean with wet cotton, Tucks or baby wipes. Apply Silvadene to a thin piece of cotton and place over the anal opening after your baths.  This will collect any drainage or bleeding.  Drainage is usually a pink to tan color and is normal for the following 2-3 weeks after surgery.  Change to cotton ever 2-3 hours while awake.  Diet Eat a regular diet.  Avoid foods that may constipate you or give you diarrhea.  Avoid foods with seeds, nuts, corn or popcorn. Beginning the day after surgery, drink 6-8 glasses of water a day in addition to your meals.  Limit you caffeine intake to 1-2 servings per day.  Medication Take a fiber supplement (Metamucil, Citrucel, FiberCon) twice a day. Take a stool softener like Colace twice daily Take your  pain medication as directed.  You may use Extra Strength Tylenol instead of your prescribed pain medication to relieve mild discomfort.  Avoid products containing aspirin.  Bowel Habits You should have a bowel movement at least every other day.  If you are constipated, you may take a Fleet enema or 2 Dulcolax tablets.  Call the office if no results occur. You may bear down a normal amount to have a bowel movement without hurting your tissues after the operation.  Activity Walking is encouraged.  You may drive when you are no longer on prescription pain medication.  You may go up and down stairs carefully. No heavy lifting or strenuous activity until after your first post operative appointment Do NOT sit on a rubber ring; instead use a soft pillow if needed.  Call the office if you have any questions or concerns.  Call IMMEDIATELY if you develop: Rectal bleeding Increasing rectal pain Fever greater than 100 F Difficulty urinating     Post Anesthesia Home Care Instructions  Activity: Get plenty of rest for the remainder of the day. A responsible adult should stay with you for 24 hours following the procedure.  For the next 24 hours, DO NOT: -Drive a car -Paediatric nurse -Drink alcoholic beverages -Take any medication unless instructed by your physician -Make any legal decisions or sign important papers.  Meals: Start with liquid foods such as  gelatin or soup. Progress to regular foods as tolerated. Avoid greasy, spicy, heavy foods. If nausea and/or vomiting occur, drink only clear liquids until the nausea and/or vomiting subsides. Call your physician if vomiting continues.  Special Instructions/Symptoms: Your throat may feel dry or sore from the anesthesia or the breathing tube placed in your throat during surgery. If this causes discomfort, gargle with warm salt water. The discomfort should disappear within 24 hours.

## 2013-08-26 ENCOUNTER — Encounter (HOSPITAL_BASED_OUTPATIENT_CLINIC_OR_DEPARTMENT_OTHER): Payer: Self-pay | Admitting: General Surgery

## 2013-08-26 NOTE — Anesthesia Postprocedure Evaluation (Signed)
Anesthesia Post Note  Patient: Julie Mcconnell  Procedure(s) Performed: Procedure(s) (LRB): LASER ABLATION CONDOLAMATA/ANAL EXAM UNDER ANESTHESIA (N/A)  Anesthesia type: MAC  Patient location: PACU  Post pain: Pain level controlled  Post assessment: Post-op Vital signs reviewed  Last Vitals: BP 144/87  Pulse 85  Temp(Src) 36.3 C (Oral)  Resp 16  Wt 249 lb (112.946 kg)  SpO2 98%  LMP 08/08/2013  Post vital signs: Reviewed  Level of consciousness: awake  Complications: No apparent anesthesia complications

## 2013-09-15 ENCOUNTER — Encounter (HOSPITAL_COMMUNITY): Admission: RE | Admit: 2013-09-15 | Payer: Medicaid Other | Source: Ambulatory Visit

## 2013-09-15 ENCOUNTER — Telehealth: Payer: Self-pay

## 2013-09-15 NOTE — Telephone Encounter (Signed)
Short stay called and patient missed her week 6 remicade infusion this am.  I have left a message for the patient to call back and discuss

## 2013-09-15 NOTE — Telephone Encounter (Signed)
Short stay called back, patient showed up 1 hour and 20 minutes late.  They can't get her back in until 09/29/13 that will be 8 weeks rather than 6 weeks for her induction.  Dr. Hilarie Fredrickson is this ok? Do we need to do anything else?

## 2013-09-15 NOTE — Telephone Encounter (Signed)
I have rescheduled her for [facility] Short Stay for Wed 09/17/13 8:00.  I have left a message for the patient to call back to discuss.  I have cancelled the infusion for 5/18.  I have scheduled her 8 week infusion for 11/12/13 8:00 Left message for patient to call back

## 2013-09-15 NOTE — Telephone Encounter (Signed)
Would schedule remicade infusion as soon as possible at either [facility] or [facility], and proceed as soon as possible Please stress the importance of these infusions for her IBD, and she should have follow-up with me in the office

## 2013-09-15 NOTE — Telephone Encounter (Signed)
Patient notified.  I have stressed to her the importance of making it to these appointments.  I have also set her up for a follow up on 11/10/13.  I have also mailed her a letter with theses appts

## 2013-09-17 ENCOUNTER — Encounter (HOSPITAL_COMMUNITY)
Admission: RE | Admit: 2013-09-17 | Discharge: 2013-09-17 | Disposition: A | Discharge status: Home / Self Care | Payer: Medicaid Other | Source: Ambulatory Visit | Attending: Internal Medicine | Admitting: Internal Medicine

## 2013-09-17 VITALS — BP 114/71 | HR 81 | Temp 98.8°F | Resp 20 | Ht 65.5 in | Wt 250.0 lb

## 2013-09-17 DIAGNOSIS — K5090 Crohn's disease, unspecified, without complications: Secondary | ICD-10-CM

## 2013-09-17 DIAGNOSIS — K5080 Crohn's disease of both small and large intestine without complications: Secondary | ICD-10-CM | POA: Diagnosis present

## 2013-09-17 MED ORDER — SODIUM CHLORIDE 0.9 % IV SOLN
5.0000 mg/kg | Freq: Once | INTRAVENOUS | Status: AC
  Administered 2013-09-17: 600 mg via INTRAVENOUS
  Filled 2013-09-17: qty 60

## 2013-09-17 MED ORDER — DIPHENHYDRAMINE HCL 25 MG PO CAPS
50.0000 mg | ORAL_CAPSULE | Freq: Once | ORAL | Status: AC
  Administered 2013-09-17: 50 mg via ORAL

## 2013-09-17 MED ORDER — ACETAMINOPHEN 325 MG PO TABS
ORAL_TABLET | ORAL | Status: AC
  Filled 2013-09-17: qty 2

## 2013-09-17 MED ORDER — ACETAMINOPHEN 325 MG PO TABS
650.0000 mg | ORAL_TABLET | Freq: Once | ORAL | Status: AC
  Administered 2013-09-17: 650 mg via ORAL

## 2013-09-17 MED ORDER — DIPHENHYDRAMINE HCL 25 MG PO CAPS
ORAL_CAPSULE | ORAL | Status: AC
  Filled 2013-09-17: qty 2

## 2013-09-17 MED ORDER — SODIUM CHLORIDE 0.9 % IV SOLN
INTRAVENOUS | Status: DC
  Administered 2013-09-17: 250 mL via INTRAVENOUS

## 2013-09-17 NOTE — Progress Notes (Signed)
Dr Hilarie Fredrickson returned page.  Orders given to try to restart the infusion of Remicade and if the pt cant tolerate it to call him back.  I restarted the rate back at a rate of 125 and will attempt to keep it infusing at this rate for the remainder of the infusion.  I instructed the pt that if she began to feel any symptoms again to let us know as soon as she felt them.  Will continue to monitor patient.

## 2013-09-17 NOTE — Progress Notes (Signed)
Pt complaining of abd pain that is sharp and shortness of breath and stated that it just started.Pt had been receiving remicade for about an hour and was at a rate of 150cc/hr.  I stopped the remicade and switched her over to NS, VS are stable.   Awaiting md return call.

## 2013-09-17 NOTE — Progress Notes (Signed)
Pt tolerated the rest of her remicade infusion well and was D/C home

## 2013-09-29 ENCOUNTER — Encounter (HOSPITAL_COMMUNITY): Payer: Medicaid Other

## 2013-10-13 ENCOUNTER — Telehealth: Payer: Self-pay | Admitting: Internal Medicine

## 2013-10-13 NOTE — Telephone Encounter (Signed)
I have explained to the patient that she has completed her induction phase on Remicade and now she will get her infusions every 8 weeks.  She is scheduled for her 8 week Remicade for 11/12/13.  She verbalized understanding.  She will call back for any additional questions or concerns

## 2013-11-05 ENCOUNTER — Encounter: Payer: Self-pay | Admitting: Internal Medicine

## 2013-11-06 ENCOUNTER — Telehealth: Payer: Self-pay | Admitting: Internal Medicine

## 2013-11-06 NOTE — Telephone Encounter (Signed)
Patient notified that she can use Tylenol per package instructions for pain

## 2013-11-10 ENCOUNTER — Other Ambulatory Visit: Payer: Self-pay

## 2013-11-10 ENCOUNTER — Ambulatory Visit: Payer: Medicaid Other | Admitting: Internal Medicine

## 2013-11-10 DIAGNOSIS — K50919 Crohn's disease, unspecified, with unspecified complications: Secondary | ICD-10-CM

## 2013-11-10 MED ORDER — DIPHENHYDRAMINE HCL 25 MG PO TABS: 50.0000 mg | ORAL_TABLET | Freq: Once | ORAL | Status: DC

## 2013-11-10 MED ORDER — ACETAMINOPHEN 325 MG PO TABS: 650.0000 mg | ORAL_TABLET | Freq: Once | ORAL | Status: DC

## 2013-11-10 MED ORDER — INFLIXIMAB 100 MG IV SOLR: 5.0000 mg/kg | Freq: Once | INTRAVENOUS | Status: DC

## 2013-11-12 ENCOUNTER — Other Ambulatory Visit (HOSPITAL_COMMUNITY): Payer: Self-pay | Admitting: Internal Medicine

## 2013-11-12 ENCOUNTER — Encounter (HOSPITAL_COMMUNITY): Admission: RE | Admit: 2013-11-12 | Payer: Medicaid Other | Source: Ambulatory Visit

## 2013-11-17 ENCOUNTER — Other Ambulatory Visit: Payer: Self-pay

## 2013-11-17 DIAGNOSIS — K50918 Crohn's disease, unspecified, with other complication: Secondary | ICD-10-CM

## 2013-11-17 MED ORDER — INFLIXIMAB 100 MG IV SOLR: 5.0000 mg/kg | Freq: Once | INTRAVENOUS | Status: DC

## 2013-11-17 MED ORDER — ACETAMINOPHEN 325 MG PO TABS: 650.0000 mg | ORAL_TABLET | Freq: Once | ORAL | Status: DC

## 2013-11-17 MED ORDER — DIPHENHYDRAMINE HCL 25 MG PO TABS: 25.0000 mg | ORAL_TABLET | Freq: Once | ORAL | Status: DC

## 2013-11-19 ENCOUNTER — Encounter (HOSPITAL_COMMUNITY): Payer: Self-pay

## 2013-11-19 ENCOUNTER — Encounter (HOSPITAL_COMMUNITY)
Admission: RE | Admit: 2013-11-19 | Discharge: 2013-11-19 | Disposition: A | Discharge status: Home / Self Care | Payer: Medicaid Other | Source: Ambulatory Visit | Attending: Internal Medicine | Admitting: Internal Medicine

## 2013-11-19 ENCOUNTER — Telehealth: Payer: Self-pay | Admitting: Internal Medicine

## 2013-11-19 DIAGNOSIS — K5090 Crohn's disease, unspecified, without complications: Secondary | ICD-10-CM | POA: Diagnosis present

## 2013-11-19 MED ORDER — SODIUM CHLORIDE 0.9 % IV SOLN
Freq: Once | INTRAVENOUS | Status: AC
  Administered 2013-11-19: 250 mL via INTRAVENOUS

## 2013-11-19 MED ORDER — SODIUM CHLORIDE 0.9 % IV SOLN
600.0000 mg | Freq: Once | INTRAVENOUS | Status: AC
  Administered 2013-11-19: 600 mg via INTRAVENOUS
  Filled 2013-11-19: qty 60

## 2013-11-19 MED ORDER — ACETAMINOPHEN 325 MG PO TABS
650.0000 mg | ORAL_TABLET | Freq: Once | ORAL | Status: AC
  Administered 2013-11-19: 650 mg via ORAL
  Filled 2013-11-19: qty 2

## 2013-11-19 MED ORDER — DIPHENHYDRAMINE HCL 25 MG PO CAPS
25.0000 mg | ORAL_CAPSULE | Freq: Once | ORAL | Status: AC
  Administered 2013-11-19: 25 mg via ORAL
  Filled 2013-11-19: qty 1

## 2013-11-19 NOTE — Telephone Encounter (Signed)
Called by infusion nurse Patient received 36 mL's of Remicade infusion this morning with premedication and developed substernal chest pain and shortness of breath along with upper abdominal burning. She reported some mild similar symptoms with last infusion, worse today. Infusion was stopped. Patient was monitored with no hypotension, tachycardia. Patient was monitored until symptoms resolve Order given to discontinue Remicade infusion Please list as an allergy Need to have patient return to clinic to discuss other IBD option

## 2013-11-19 NOTE — Progress Notes (Signed)
Pt states she no longer is having any chest pain, shortness of breath or burning in abdomen.  VSS  Pt warm and dry. Pt discharged ambulatory accompanied by Mom and two children

## 2013-11-19 NOTE — Addendum Note (Signed)
Addended by: Marlon Pel on: 11/19/2013 04:51 PM   Modules accepted: Orders, Medications

## 2013-11-19 NOTE — Progress Notes (Signed)
Dr Hilarie Fredrickson called back . Pt's chest pain and shortness of breath are resolving. Pt had only received 32 cc of the Remicaide infusion. Orders received to discontinue the Remicaide and office will call patient to come in and discuss other options. We are to observe patient for 45 minutes and if symptoms resolved may be discharged This message was relayed to patient. BP 127/87  67 20 99% on 02

## 2013-11-19 NOTE — Progress Notes (Addendum)
Remicaide had been infusing for approx 40 minutes and patient called to nurses station "I  Can't breathe and my chest hurts" Pt has a panic expression Warm and dry Remicaide infusion stopped, Saline wide open connected.02 at 2 liters started BP 144/94 p 95 r 20 02 sat 97 %. Placed call to Dr Hilarie Fredrickson.

## 2013-11-19 NOTE — Discharge Instructions (Signed)
DR PYRTLES OFFICE WILL BE CALLING YOU TO COME INTO THE OFFICE TO DISCUSS OTHER OPTIONS TO TREAT YOUR CROHNS  IF YOU HAVE NOT HEARD FROM THEM BY Friday  CALL OFFICE Julie Mcconnell

## 2013-11-19 NOTE — Telephone Encounter (Signed)
I spoke with the patient and notified her of recommendations remicade listed as an allergy She is scheduled for follow up for 01/08/14

## 2013-11-27 ENCOUNTER — Telehealth: Payer: Self-pay | Admitting: Internal Medicine

## 2013-11-27 DIAGNOSIS — K50919 Crohn's disease, unspecified, with unspecified complications: Secondary | ICD-10-CM

## 2013-11-27 DIAGNOSIS — R109 Unspecified abdominal pain: Secondary | ICD-10-CM

## 2013-11-27 MED ORDER — PREDNISONE 10 MG PO TABS: 40.0000 mg | ORAL_TABLET | Freq: Every day | ORAL | Status: DC

## 2013-11-27 NOTE — Telephone Encounter (Signed)
CBC, CMP, 2view abd xray PCP, ophthalmology, or ED for eye pain Prednisone 40 mg PO x 5 days, decreasing every 5 days thereafter until completed APP visit next available Likely Humira start

## 2013-11-27 NOTE — Telephone Encounter (Signed)
Patient notified of the recommendations rx sent to the pharmacy She verbalized understanding of prednisone and instructions.   She is instructed to see primary care, opthamology or ER for her eye

## 2013-11-27 NOTE — Telephone Encounter (Signed)
I spoke with the patient and she is c/o pain "that balls me up for about 15 minutes" she reports that pain is intermittent, but happens all day and night.  Pain is mid abdomen "close to my belly button".  She is not able to sleep for the pain.  She reports that she is having diarrhea some minimal blood in the stool. She denies nausea and vomiting.  Her Remicade had to be stopped last week due to a reaction.  Patient reports pain in her eyes when she looks at a bright light and feels red and dry.  She reports that she has been treated with antibiotics in the past for her eye pain.  She is advised that she should see her primary care about her eye pain.  Dr. Hilarie Fredrickson she is requesting something for pain. I do not have any APP openings until next Wed.  Please advise

## 2013-12-01 ENCOUNTER — Ambulatory Visit (INDEPENDENT_AMBULATORY_CARE_PROVIDER_SITE_OTHER)
Admission: RE | Admit: 2013-12-01 | Discharge: 2013-12-01 | Disposition: A | Discharge status: Home / Self Care | Payer: Medicaid Other | Source: Ambulatory Visit | Attending: Internal Medicine | Admitting: Internal Medicine

## 2013-12-01 ENCOUNTER — Telehealth: Payer: Self-pay | Admitting: Internal Medicine

## 2013-12-01 DIAGNOSIS — K5090 Crohn's disease, unspecified, without complications: Secondary | ICD-10-CM

## 2013-12-01 DIAGNOSIS — R109 Unspecified abdominal pain: Secondary | ICD-10-CM

## 2013-12-01 DIAGNOSIS — K50919 Crohn's disease, unspecified, with unspecified complications: Secondary | ICD-10-CM

## 2013-12-01 NOTE — Telephone Encounter (Signed)
See results on imaging for additional details

## 2013-12-03 ENCOUNTER — Ambulatory Visit: Payer: Medicaid Other | Admitting: Nurse Practitioner

## 2013-12-13 ENCOUNTER — Encounter (HOSPITAL_COMMUNITY): Payer: Self-pay | Admitting: Emergency Medicine

## 2013-12-13 ENCOUNTER — Emergency Department (HOSPITAL_COMMUNITY)
Admission: EM | Admit: 2013-12-13 | Discharge: 2013-12-14 | Disposition: A | Discharge status: Home / Self Care | Payer: Medicaid Other | Attending: Emergency Medicine | Admitting: Emergency Medicine

## 2013-12-13 DIAGNOSIS — H53149 Visual discomfort, unspecified: Secondary | ICD-10-CM | POA: Diagnosis not present

## 2013-12-13 DIAGNOSIS — F17200 Nicotine dependence, unspecified, uncomplicated: Secondary | ICD-10-CM | POA: Diagnosis not present

## 2013-12-13 DIAGNOSIS — IMO0002 Reserved for concepts with insufficient information to code with codable children: Secondary | ICD-10-CM | POA: Diagnosis not present

## 2013-12-13 DIAGNOSIS — H209 Unspecified iridocyclitis: Secondary | ICD-10-CM | POA: Diagnosis not present

## 2013-12-13 DIAGNOSIS — Z8709 Personal history of other diseases of the respiratory system: Secondary | ICD-10-CM | POA: Diagnosis not present

## 2013-12-13 DIAGNOSIS — Z87828 Personal history of other (healed) physical injury and trauma: Secondary | ICD-10-CM | POA: Diagnosis not present

## 2013-12-13 DIAGNOSIS — H5710 Ocular pain, unspecified eye: Secondary | ICD-10-CM | POA: Diagnosis present

## 2013-12-13 DIAGNOSIS — Z8719 Personal history of other diseases of the digestive system: Secondary | ICD-10-CM | POA: Diagnosis not present

## 2013-12-13 MED ORDER — OLOPATADINE HCL 0.1 % OP SOLN
1.0000 [drp] | Freq: Two times a day (BID) | OPHTHALMIC | Status: DC
  Administered 2013-12-14: 1 [drp] via OPHTHALMIC
  Filled 2013-12-13: qty 5

## 2013-12-13 MED ORDER — TETRACAINE HCL 0.5 % OP SOLN
2.0000 [drp] | Freq: Once | OPHTHALMIC | Status: AC
  Administered 2013-12-13: 2 [drp] via OPHTHALMIC
  Filled 2013-12-13: qty 2

## 2013-12-13 MED ORDER — FLUORESCEIN SODIUM 1 MG OP STRP
1.0000 | ORAL_STRIP | Freq: Once | OPHTHALMIC | Status: AC
  Administered 2013-12-13: via OPHTHALMIC
  Filled 2013-12-13: qty 1

## 2013-12-13 NOTE — ED Notes (Signed)
20/20 both eyes.

## 2013-12-13 NOTE — ED Provider Notes (Signed)
CSN: [id number]     Arrival date & time 12/13/13  2207 History  This chart was scribed for Julie Senior, PA-C, working with Kalman Drape, MD by Steva Colder, ED Scribe. The patient was seen in room TR04C/TR04C at 11:36 PM.    Chief Complaint  Patient presents with  . Eye Problem    The history is provided by the patient. No language interpreter was used.   HPI Comments: Julie Mcconnell is a 28 y.o. female who presents to the Emergency Department complaining of an right eye problem onset 2 months ago. She states that she had surgery for Crohn's at [facility] and had anesthesia medications in that eye.  She states that she was given ofloxacin Abx eye drops. She states that she has been using it for 2 months off and on. She states that the last time that she used her drops was yesterday. She states that her eye is sore. She states that her eye has been red for about a week. She denies visual changes,  states that she is having associated symptoms of  eye pain, eye redness, photophobia. She states that the eye pain just came back yesterday. She denies any other associated symptoms. She states that she used to wear color contacts but had to stop because they began to mess up her eyes. She states she uses mild soap for face wash and uses mascara, and admits "stabbing myself in the eye with it pretty frequently."      Past Medical History  Diagnosis Date  . History of chronic bronchitis   . Crohn's disease   . GERD (gastroesophageal reflux disease)   . History of concussion     child--  no residual   Past Surgical History  Procedure Laterality Date  . Cesarean section  01-26-2005  . Inner ear surgery Right as child  . Umbilical hernia repair  age 69  . Tonsillectomy  as child  . Laser ablation condolamata N/A 08/25/2013    Procedure: LASER ABLATION CONDOLAMATA/ANAL EXAM UNDER ANESTHESIA;  Surgeon: Leighton Ruff, MD;  Location: [facility];  Service: General;  Laterality: N/A;    Family History  Problem Relation Age of Onset  . Heart disease Maternal Grandmother    History  Substance Use Topics  . Smoking status: Current Every Day Smoker -- 1.50 packs/day for 6 years    Types: Cigarettes  . Smokeless tobacco: Never Used  . Alcohol Use: 0.6 oz/week    1 Cans of beer per week   OB History   Grav Para Term Preterm Abortions TAB SAB Ect Mult Living   4 4 4       4      Review of Systems  Constitutional: Negative for fever and chills.  HENT: Negative for congestion.   Eyes: Positive for photophobia, pain, discharge and redness. Negative for itching and visual disturbance.  Neurological: Negative for headaches.      Allergies  Remicade  Home Medications   Prior to Admission medications   Medication Sig Start Date End Date Taking? Authorizing Provider  predniSONE (DELTASONE) 10 MG tablet Take 4 tablets (40 mg total) by mouth daily with breakfast. 11/27/13   Jerene Bears, MD   BP 149/81  Pulse 102  Temp(Src) 99.8 F (37.7 C) (Oral)  Resp 20  Ht 5' 4"  (1.626 m)  Wt 245 lb (111.131 kg)  BMI 42.03 kg/m2  SpO2 100%  LMP 11/13/2013   Physical Exam  Nursing note and vitals  reviewed. Constitutional: She is oriented to person, place, and time. She appears well-developed and well-nourished. No distress.  HENT:  Head: Normocephalic and atraumatic.  Eyes: EOM and lids are normal. Pupils are equal, round, and reactive to light. Lids are everted and swept, no foreign bodies found. Right conjunctiva is injected. Right conjunctiva has no hemorrhage.  Slit lamp exam:      The right eye shows no corneal abrasion, no corneal flare, no corneal ulcer, no foreign body, no hyphema, no hypopyon and no fluorescein uptake.       The left eye shows no hyphema and no hypopyon.  Neck: Neck supple. No tracheal deviation present.  Cardiovascular: Normal rate.   Pulmonary/Chest: Effort normal. No respiratory distress.  Musculoskeletal: Normal range of motion.   Neurological: She is alert and oriented to person, place, and time.  Skin: Skin is warm and dry.  Psychiatric: She has a normal mood and affect. Her behavior is normal.    ED Course  Procedures (including critical care time) DIAGNOSTIC STUDIES: Oxygen Saturation is 100% on room air, normal by my interpretation.    COORDINATION OF CARE: 11:39 PM-Discussed treatment plan which includes Patanol eye drops with pt at bedside and pt agreed to plan.   Labs Review Labs Reviewed - No data to display  Imaging Review No results found.   EKG Interpretation None      MDM   Final diagnoses:  Uveitis    Patient with right eye injection, sensitivity to light, pain. Visual acuity is 20/20 bilaterally. Fluorescein stain was normal. There is no hyphema or hypopyon. No injury to the eye other than possible injury with mascara wand and she does report that they got "anesthesia medicine and to the eye" 2 months ago. She reports associated redness inflammation on off since then. Last time states it cleared up with ofloxacin drops. She reports the eye has been red for the last week but the pain came back yesterday. Given patient's history of Crohn's disease, her presentation an eye exam findings are suspicious for uveitis. I contacted and spoke with Dr. Anderson Malta, ophthalmology, who agrees with my assessment, recommended starting patient on atropine drops and prednisolone drops. And they will followup with her in the office on Monday. Patient is to call and make an appointment. Patient agreed to the plan.  Filed Vitals:   12/13/13 2226  BP: 149/81  Pulse: 102  Temp: 99.8 F (37.7 C)  TempSrc: Oral  Resp: 20  Height: 5' 4"  (1.626 m)  Weight: 245 lb (111.131 kg)  SpO2: 100%     I personally performed the services described in this documentation, which was scribed in my presence. The recorded information has been reviewed and is accurate.     Renold Genta, PA-C 12/14/13 [phone]

## 2013-12-13 NOTE — ED Notes (Signed)
The pt is c/o rt eye pain for 2 months after she had surgery at [facility] long and had anesthesia med in that eye.  Difficulty seeing

## 2013-12-14 MED ORDER — PREDNISOLONE ACETATE 1 % OP SUSP
1.0000 [drp] | OPHTHALMIC | Status: DC
  Administered 2013-12-14 (×2): 1 [drp] via OPHTHALMIC
  Filled 2013-12-14: qty 1

## 2013-12-14 MED ORDER — ATROPINE SULFATE 1 % OP SOLN
1.0000 [drp] | Freq: Four times a day (QID) | OPHTHALMIC | Status: DC
  Filled 2013-12-14: qty 2

## 2013-12-14 NOTE — Discharge Instructions (Signed)
Atropine drops, use 1 drop twice a day in right eye. Prednisolone drops, 1 drop in right eye every 4 hrs while awake. Follow up with Dr. Anderson Malta on Monday, call her office first thing in the morning for an apt that day.    Uveitis The uveal tract (uvea) is a layer of the eye made up of three structures:   The choroid - a layer containing the eye's blood vessels located between the outer white part of the eyeball (sclera) and the inner layer (retina).  The iris - the colored portion of the eye.  The ciliary body - a area of muscle at the base of the iris that helps focus the lens. Uveitis happens when any or all portions of the uveal tract become inflamed.   Iritis is when the iris becomes inflamed. It is the most common form of uveitis. It is extremely important to treat iritis early, as it may lead to internal eye damage causing scarring or diseases such as glaucoma. Some people have only one attack of iritis (in one or both eyes) in their lifetime, while others may get it many times.  Pars planitis is when the ciliary body becomes inflamed.  Choroiditis is when the choroid layer becomes inflamed. If the inflammation also involves the retina, it is called chorioretinitis.  Panuveitis is when inflammation affects all of the structures of the uveal tract. CAUSES   Diseases where the body's immune system attacks tissues within your own body (autoimmune diseases).  Infections (tuberculosis, gonorrhea, fungus infections, Lyme disease, infection of the lining of the heart).  Trauma or injury.  Eye diseases (acute glaucoma and others).  Inflammation from other parts of the uveal track.  Severe eye infections.  Other rare diseases. SYMPTOMS  Iritis  Eye pain or aching.  Light sensitivity.  Loss of sight or blurred vision.  Redness of the eye. This is often accompanied by a ring of redness around the outside of the cornea or clear covering at the front of the eye (ciliary  flush).  Excessive tearing of the eye(s).  A small pupil that does not enlarge in the dark and stays smaller than the other eye's pupil.  A whitish area that obscures the lower part of the colored iris. Sometimes this is visible when looking at the eye. Since iritis causes the eye to become red, it is often confused with a much less dangerous form of "pink eye" or conjunctivitis. One of the most important symptoms is sensitivity to light. Anytime there is redness, discomfort in the eye(s) and extreme light sensitivity, it is extremely important to see an ophthalmologist as soon as possible. Pars Planitis  Mild, progressive drop in vision.  Floating spots in the field of vision in front of the eye.  Development of a cataract. Choroiditis  May have no symptoms.  Progressive drop in vision.  Loss of vision in an isolated area of peripheral vision (scotoma) or to the side.  Sore, aching eye. TREATMENT  Iritis  Corticosteroid eye drops and dilating agent eye drops are often given. Corticosteroid eye drops are used to decrease inflammation. Dilating drops help to prevent scarring of the iris and the risk of the iris becoming "stuck" to the front surface of the lens. Follow your caregivers exact instructions on taking and stopping corticosteroid medications (drops or pills).  Sometimes, the iritis will be so severe that it will not respond to commonly used medications. If this happens, it may be necessary to use steroid injections. The  injections are given under the eye's outer surface. Sometimes oral medications are given. Treatment used for iritis is usually made on an individual basis. Pars Planitis  If mild, no treatment may be required  Corticosteroids are usually given orally or for severe cases, injections are given under the surface of the eye. Choroiditis  If associated with an underlying disease, the disease itself is treated.  Corticosteroids are usually administered by  mouth (orally).  Corticosteroid injections or other medicines specific are given to treat the cause of infection or inflammation. These injections are given into the interior cavity of the eye (intravitreal injection).  If caused by a parasite or worm that can be seen by the ophthalmologists, laser treatments may be used to kill the parasite. Panuveitis  Treatment depends upon the cause and severity of the inflammation. It may consist of any one, or a combination of the treatments outlined above. HOME CARE INSTRUCTIONS  Your caregiver will give specific instructions regarding the use of eye medications or other medications. Follow all instructions in both taking and stopping the medications. SEEK IMMEDIATE MEDICAL CARE IF:  You have redness of one or both eyes.  You experience a great deal of light sensitivity.  You have pain or aching in either eye.  You notice a drop in vision in either eye. MAKE SURE YOU:   Understand these instructions.  Will watch your condition.  Will get help right away if you are not doing well or get worse. Document Released: 07/28/2008 Document Revised: 07/24/2011 Document Reviewed: 07/28/2008 [facility] Patient Information 2015 [place], [place]. This information is not intended to replace advice given to you by your health care provider. Make sure you discuss any questions you have with your health care provider.

## 2013-12-14 NOTE — ED Provider Notes (Signed)
Medical screening examination/treatment/procedure(s) were performed by non-physician practitioner and as supervising physician I was immediately available for consultation/collaboration.   EKG Interpretation None       Kalman Drape, MD 12/14/13 [phone]

## 2014-01-08 ENCOUNTER — Ambulatory Visit: Payer: Medicaid Other | Admitting: Internal Medicine

## 2014-01-08 ENCOUNTER — Telehealth: Payer: Self-pay | Admitting: Internal Medicine

## 2014-01-08 NOTE — Telephone Encounter (Signed)
I want to determine if the patient's witching all GI care? If not, she needs followup for IBD after having to stop Remicade due to an infusion reaction

## 2014-01-08 NOTE — Telephone Encounter (Signed)
Please advise 

## 2014-01-08 NOTE — Telephone Encounter (Signed)
Pt states she is going to be seen by another GI doctor. States she is trying to get this arranged, does not have a name of physician yet.

## 2014-01-14 ENCOUNTER — Encounter (HOSPITAL_COMMUNITY): Payer: Medicaid Other

## 2014-02-28 ENCOUNTER — Telehealth: Payer: Self-pay | Admitting: Gastroenterology

## 2014-02-28 MED ORDER — PREDNISONE 10 MG PO TABS: 40.0000 mg | ORAL_TABLET | Freq: Every day | ORAL | Status: DC

## 2014-02-28 NOTE — Telephone Encounter (Signed)
Having several days of worsening diarrhea, lower abdominal pain.  Symptoms are c/w her Crohns colitis.  Requesting steroids. Have prescribed pred 40 mg qd and instructed to call office for app't next week.

## 2014-03-02 NOTE — Telephone Encounter (Signed)
Pt has ov with Dr. Hilarie Fredrickson tomorrow.

## 2014-03-02 NOTE — Telephone Encounter (Signed)
Pt previously stated to my nurse that she was seeking care elsewhere With symptoms of active IBD, I would suggest she do so ASAP if that is still her desire JMP

## 2014-03-02 NOTE — Telephone Encounter (Signed)
Attempted to call pt no answer  

## 2014-03-03 ENCOUNTER — Ambulatory Visit (INDEPENDENT_AMBULATORY_CARE_PROVIDER_SITE_OTHER): Payer: Medicaid Other | Admitting: Internal Medicine

## 2014-03-03 ENCOUNTER — Other Ambulatory Visit (INDEPENDENT_AMBULATORY_CARE_PROVIDER_SITE_OTHER): Payer: Medicaid Other

## 2014-03-03 ENCOUNTER — Telehealth: Payer: Self-pay | Admitting: *Deleted

## 2014-03-03 ENCOUNTER — Telehealth: Payer: Self-pay | Admitting: Internal Medicine

## 2014-03-03 ENCOUNTER — Encounter: Payer: Self-pay | Admitting: Internal Medicine

## 2014-03-03 VITALS — BP 112/80 | HR 72 | Ht 65.5 in | Wt 250.0 lb

## 2014-03-03 DIAGNOSIS — K5090 Crohn's disease, unspecified, without complications: Secondary | ICD-10-CM

## 2014-03-03 DIAGNOSIS — Z23 Encounter for immunization: Secondary | ICD-10-CM

## 2014-03-03 DIAGNOSIS — Z79899 Other long term (current) drug therapy: Secondary | ICD-10-CM

## 2014-03-03 DIAGNOSIS — K5080 Crohn's disease of both small and large intestine without complications: Secondary | ICD-10-CM

## 2014-03-03 DIAGNOSIS — A630 Anogenital (venereal) warts: Secondary | ICD-10-CM

## 2014-03-03 LAB — CBC WITH DIFFERENTIAL/PLATELET
Basophils Absolute: 0 10*3/uL (ref 0.0–0.1)
Basophils Relative: 0.6 % (ref 0.0–3.0)
EOS ABS: 0.2 10*3/uL (ref 0.0–0.7)
Eosinophils Relative: 4.1 % (ref 0.0–5.0)
HEMATOCRIT: 41 % (ref 36.0–46.0)
Hemoglobin: 13.4 g/dL (ref 12.0–15.0)
LYMPHS ABS: 2.3 10*3/uL (ref 0.7–4.0)
Lymphocytes Relative: 38.8 % (ref 12.0–46.0)
MCHC: 32.6 g/dL (ref 30.0–36.0)
MCV: 90.8 fl (ref 78.0–100.0)
Monocytes Absolute: 0.7 10*3/uL (ref 0.1–1.0)
Monocytes Relative: 11.1 % (ref 3.0–12.0)
NEUTROS PCT: 45.4 % (ref 43.0–77.0)
Neutro Abs: 2.7 10*3/uL (ref 1.4–7.7)
PLATELETS: 320 10*3/uL (ref 150.0–400.0)
RBC: 4.51 Mil/uL (ref 3.87–5.11)
RDW: 15.1 % (ref 11.5–15.5)
WBC: 5.9 10*3/uL (ref 4.0–10.5)

## 2014-03-03 LAB — COMPREHENSIVE METABOLIC PANEL
ALT: 21 U/L (ref 0–35)
AST: 18 U/L (ref 0–37)
Albumin: 3.5 g/dL (ref 3.5–5.2)
Alkaline Phosphatase: 67 U/L (ref 39–117)
BILIRUBIN TOTAL: 0.6 mg/dL (ref 0.2–1.2)
BUN: 8 mg/dL (ref 6–23)
CO2: 27 mEq/L (ref 19–32)
CREATININE: 0.6 mg/dL (ref 0.4–1.2)
Calcium: 9.4 mg/dL (ref 8.4–10.5)
Chloride: 105 mEq/L (ref 96–112)
GFR: 158.31 mL/min (ref >60.00)
GLUCOSE: 114 mg/dL — AB (ref 70–99)
Potassium: 3.8 mEq/L (ref 3.5–5.1)
SODIUM: 139 meq/L (ref 135–145)
Total Protein: 7.9 g/dL (ref 6.0–8.3)

## 2014-03-03 LAB — HIGH SENSITIVITY CRP: CRP HIGH SENSITIVITY: 15.16 mg/L — AB (ref 0.000–5.000)

## 2014-03-03 MED ORDER — PREDNISONE 10 MG PO TABS: ORAL_TABLET | ORAL | Status: DC

## 2014-03-03 MED ORDER — MOVIPREP 100 G PO SOLR: 1.0000 | Freq: Once | ORAL | Status: DC

## 2014-03-03 MED ORDER — HYOSCYAMINE SULFATE 0.125 MG SL SUBL: SUBLINGUAL_TABLET | SUBLINGUAL | Status: DC

## 2014-03-03 NOTE — Patient Instructions (Addendum)
  Your physician has requested that you go to the basement for the following lab work before leaving today:CBC, CMP, Quantitative gold and High Sensitivity CRP  Start Prednisone as follows: 67m for one week, 345mfor one week, 3062mor one week, 27m68mr one week, 20mg55m one week, 15mg 55mone week, 10mg f36mne week.   A nurse will contact you at [phone] Cimzia, to set up your treatments.  You will receive your Flu shot today.  Follow up in 8 - 12 weeks with Dr. Pyrtle.Julie Mcconnell

## 2014-03-03 NOTE — Telephone Encounter (Signed)
[facility] Triage Call Report Triage Record Num: [id number] Operator: Geradine Girt Patient Name: Julie Mcconnell Call Date & Time: 02/28/2014 5:59:39PM Patient Phone: [phone] PCP: Patient Gender: Female PCP Fax : Patient DOB: [birth year] Practice Name: [facility] Reason for Call: Caller: [name]/Patient; PCP: Other; CB#: [phone]; Patient called stating her Crohn's is flairing up & does not have any medications. Having diarrhea with sharp pain, back pain, cold sweats but has not taken temperature. Is requesting steroid as she took her last one 2 days ago. Review of EMR shows pt is [facility] patient. Gave patient that number for care. Protocol(s) Used: Office Note Recommended Outcome per Protocol: Information Noted and Sent to Office Reason for Outcome: Caller information to office Care Advice: ~ 10/

## 2014-03-03 NOTE — Progress Notes (Signed)
Subjective:    Patient ID: Julie Mcconnell, female    DOB: 1986-01-06, 28 y.o.   MRN: [id number]  HPI Julie Mcconnell is a 28 yo female with ileo-colonic Crohn's, anal condyloma who is seen for followup. Her ileocolonic Crohn's disease was confirmed by colonoscopy and biopsy. She was started on Remicade and at her last induction dose in July had an infusion reaction manifested by chest pressure and dyspnea. The infusion was stopped and followup is recommended. She missed several followup appointments and at one point informed Julie Mcconnell that she would be seeking care closer to her home. Then she made followup appointment with me today. She is here today with her 2 children. She reports that she has been taking low-dose prednisone over the last several months. Over the last one to 2 weeks she's had worsening of her diffuse abdominal pain, ongoing issues with loose stools and diarrhea at times what he stools. She denies fevers or chills. Overall appetite has been good without nausea or vomiting. No hepatobiliary complaints. She reports her weight has been stable. Denies rash, oral ulcers, joint pains. She has been avoiding over-the-counter NSAIDs. She continues to smoke tobacco. She denies illicit drug use.  She did have exam under anesthesia and laser ablation of anal condyloma on 34/19/3790 by Dr. Leighton Ruff.    Review of Systems As per history of present illness, otherwise negative  Current Medications, Allergies, Past Medical History, Past Surgical History, Family History and Social History were reviewed in [employer] record.       Objective:   Physical Exam BP 112/80  Pulse 72  Ht 5' 5.5" (1.664 m)  Wt 250 lb (113.399 kg)  BMI 40.95 kg/m2 Constitutional: Well-developed and well-nourished. No distress. HEENT: Normocephalic and atraumatic. Oropharynx is clear and moist. No oropharyngeal exudate. Conjunctivae are normal.  No scleral icterus. Neck: Neck supple. Trachea  midline. Cardiovascular: Normal rate, regular rhythm and intact distal pulses. No M/R/G Pulmonary/chest: Effort normal and breath sounds normal. No wheezing, rales or rhonchi. Abdominal: Soft, lower abdominal tenderness, mild without rebound or guarding, nondistended. Bowel sounds active throughout. Extremities: no clubbing, cyanosis, or edema Lymphadenopathy: No cervical adenopathy noted. Neurological: Alert and oriented to person place and time. Psychiatric: Normal mood and affect. Behavior is normal.  Colonoscopy along with pathology results reviewed along with discussed with patient    Assessment & Plan:  28 yo female with ileo-colonic Crohn's, anal condyloma who is seen for followup.  1. Ileocolonic Crohn's disease -- unfortunately she has missed several followup appointments and I had a long discussion with her today about the importance of keeping appointments with me along with taking medications as prescribed. We discussed how Crohn's disease is a chronic disease and will likely require chronic therapy to maintain remission. We discussed steroid therapy and the risks associated with long-term side effects including the complications. We also discussed how most complications associated with IBD are driven by steroid use. I do not want her on long-term steroid therapy. She had an infusion reaction with Remicade and so this drug cannot be reused.  I have recommended starting her on Cimzia because this company has the ability to go to her home to administer each dose. I do think she will need direct supervision to help take doses on time and correctly.  We also discussed the risks of these medications in great detail including the risk of infection (including reactivation of latent TB and underlying viral hepatitis), hepatotoxicity, leukopenia,  malignancy (specifically lymphoma),  demyelinating disease, and even heart failure. She is agreeable to proceed. She received a refill of prednisone when  she called the on-call provider several days ago. Current plan is to continue prednisone 40 mg x7 days decreasing by 5 mg every 7 days until off. This would be an 8 week taper. We have applied for initiation of Cimzia and the nurse assistance program. --Return in 8 weeks --Labs today CBC, CMP, high sensitivity CRP and quantiferon gold.   --Flu shot today, previous Pneumovax given --Avoid NSAIDs  Hopefully Julie Mcconnell will understand the chronic nature of her disease and I tried to impress upon her the importance of timely followup and taking medications and come in for lab tests as prescribed/recommended. 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 Marcello Moores. Followup recommendation per Dr. Marcello Moores

## 2014-03-03 NOTE — Telephone Encounter (Signed)
Pt states the Prednisone tablets that she has are 50m tablets. Pt needs script called in for 136mtablets to be able to break them in half for the correct doses. Script sent to pharmacy for pt. Pt aware.

## 2014-03-05 LAB — QUANTIFERON TB GOLD ASSAY (BLOOD)
INTERFERON GAMMA RELEASE ASSAY: NEGATIVE
MITOGEN VALUE: 8.84 [IU]/mL
Quantiferon Nil Value: 0.03 IU/mL
Quantiferon Tb Ag Minus Nil Value: 0.01 IU/mL
TB Ag value: 0.04 IU/mL

## 2014-03-11 ENCOUNTER — Telehealth: Payer: Self-pay | Admitting: Internal Medicine

## 2014-03-11 NOTE — Telephone Encounter (Signed)
Pt called and states that she received a delivery of a cooler in the mail but there was no medication in the bag. Call placed to [facility] and they will call and touch base with the patient and straighten things out.

## 2014-03-16 ENCOUNTER — Encounter: Payer: Self-pay | Admitting: Internal Medicine

## 2014-03-17 ENCOUNTER — Telehealth: Payer: Self-pay | Admitting: Internal Medicine

## 2014-03-17 NOTE — Telephone Encounter (Signed)
Pt to see PCP for eye and needs UA and culture on urine. Alvis Lemmings RN, Elmyra Ricks, will tell pt to see PCP regarding eye and urine. Cimzia to be rescheduled for 1 week per Dr. Hilarie Fredrickson.

## 2014-03-21 ENCOUNTER — Encounter (HOSPITAL_COMMUNITY): Payer: Self-pay | Admitting: Emergency Medicine

## 2014-03-21 ENCOUNTER — Emergency Department (HOSPITAL_COMMUNITY)
Admission: EM | Admit: 2014-03-21 | Discharge: 2014-03-21 | Disposition: A | Discharge status: Home / Self Care | Payer: Medicaid Other | Source: Home / Self Care | Attending: Family Medicine | Admitting: Family Medicine

## 2014-03-21 DIAGNOSIS — H209 Unspecified iridocyclitis: Secondary | ICD-10-CM

## 2014-03-21 DIAGNOSIS — Z3201 Encounter for pregnancy test, result positive: Secondary | ICD-10-CM

## 2014-03-21 DIAGNOSIS — N390 Urinary tract infection, site not specified: Secondary | ICD-10-CM

## 2014-03-21 DIAGNOSIS — Z3490 Encounter for supervision of normal pregnancy, unspecified, unspecified trimester: Secondary | ICD-10-CM

## 2014-03-21 LAB — POCT URINALYSIS DIP (DEVICE)
Glucose, UA: 100 mg/dL — AB
HGB URINE DIPSTICK: NEGATIVE
Ketones, ur: NEGATIVE mg/dL
NITRITE: NEGATIVE
Protein, ur: 30 mg/dL — AB
SPECIFIC GRAVITY, URINE: 1.025 (ref 1.005–1.030)
Urobilinogen, UA: >=8 mg/dL (ref 0.0–1.0)
pH: 6 (ref 5.0–8.0)

## 2014-03-21 LAB — POCT PREGNANCY, URINE: Preg Test, Ur: POSITIVE — AB

## 2014-03-21 MED ORDER — NITROFURANTOIN MONOHYD MACRO 100 MG PO CAPS: 100.0000 mg | ORAL_CAPSULE | Freq: Two times a day (BID) | ORAL | Status: DC

## 2014-03-21 MED ORDER — TETRACAINE HCL 0.5 % OP SOLN
OPHTHALMIC | Status: AC
  Filled 2014-03-21: qty 2

## 2014-03-21 MED ORDER — TETRACAINE HCL 0.5 % OP SOLN
2.0000 [drp] | Freq: Once | OPHTHALMIC | Status: AC
  Administered 2014-03-21: 2 [drp] via OPHTHALMIC

## 2014-03-21 NOTE — ED Provider Notes (Signed)
CSN: [id number]     Arrival date & time 03/21/14  1616 History   First MD Initiated Contact with Patient 03/21/14 1628     Chief Complaint  Patient presents with  . Conjunctivitis  . 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 She has 2 weeks of right eye injection, severe pain, photophobia, and clear drainage. She thinks this started when she had a piece of hair gets stuck in her eye. Since then the redness and pain have gotten progressively worse. She started using the prednisone and atropine drops that she was given last time but this time they are not helping. She is not using them as frequently as she was told to use them in the past because they give her a headache. It also caused her eyes to burn. Additionally she complains of 2-3 months of foul-smelling urine and urinary frequency, and burning with urination. Also she has not had a period in 2 months. No fever, chills, NVD. She just started on a 7 week taper of prednisone by her gastroenterologist. She is due to start a biologic medication next week. No blurry vision.  Past Medical History  Diagnosis Date  . History of chronic bronchitis   . Crohn's disease   . GERD (gastroesophageal reflux disease)   . History of concussion     child--  no residual   Past Surgical History  Procedure Laterality Date  . Cesarean section  01-26-2005  . Inner ear surgery Right as child  . Umbilical hernia repair  age 89  . Tonsillectomy  as child  . Laser ablation condolamata N/A 08/25/2013    Procedure: LASER ABLATION CONDOLAMATA/ANAL EXAM UNDER ANESTHESIA;  Surgeon: Leighton Ruff, MD;  Location: [facility];  Service: General;  Laterality: N/A;   Family History  Problem Relation Age of Onset  . Heart disease Maternal Grandmother     History  Substance Use Topics  . Smoking status: Current Every Day Smoker -- 1.50 packs/day for 6 years    Types: Cigarettes  . Smokeless tobacco: Never Used  . Alcohol Use: 0.6 oz/week    1 Cans of beer per week   OB History    Gravida Para Term Preterm AB TAB SAB Ectopic Multiple Living   _0 Review of Systems  Constitutional: Negative for fever and chills.  Eyes: Positive for photophobia, pain, discharge and redness. Negative for itching and visual disturbance.  Genitourinary: Positive for dysuria, urgency, frequency and menstrual problem. Negative for hematuria.       Foul-smelling urine  All other systems reviewed and are negative.   Allergies  Remicade  Home Medications   Prior to Admission medications   Medication Sig Start Date End Date Taking? Authorizing Provider  hyoscyamine (LEVSIN SL) 0.125 MG SL tablet 1-2 tablets every 6 hours as needed for Abdominal pain and cramping 03/03/14  Yes Jerene Bears, MD  predniSONE (DELTASONE) 10 MG tablet Take 4 tablets (40 mg total) by mouth daily with breakfast. 02/28/14  Yes Inda Castle, MD  MOVIPREP 100 G SOLR Take 1 kit (200 g total) by mouth once. 03/03/14   Jerene Bears, MD  nitrofurantoin, macrocrystal-monohydrate, (MACROBID) 100 MG capsule Take 1 capsule (100 mg total) by mouth 2 (two) times daily. 03/21/14   Liam Derosa,  PA-C  predniSONE (DELTASONE) 10 MG tablet 19m for one week, 337mfor one week, 3020mor one week, 80m15mr one week, 20mg75m one week, 15mg 79mone week, 10mg f57mne week. 03/03/14   Jay M PJerene BearsredniSONE (DELTASONE) 10 MG tablet Take as directed 03/03/14   Jay M PJerene BearsBP 145/92 mmHg  Pulse 116  Temp(Src) 98.6 F (37 C) (Oral)  Resp 14  SpO2 98%  LMP 02/01/2014 Physical Exam  Constitutional: She is oriented to person, place, and time. Vital signs are normal. She appears well-developed and well-nourished. No distress.  HENT:  Head: Normocephalic and atraumatic.   Eyes: EOM are normal. Pupils are equal, round, and reactive to light. Right eye exhibits discharge (clear ). Right eye exhibits no chemosis. Right conjunctiva is injected. Left conjunctiva is not injected.  Slit lamp exam:      The right eye shows no corneal abrasion, no corneal ulcer, no hyphema, no hypopyon, no fluorescein uptake and no anterior chamber bulge.  Cardiovascular: Normal rate, regular rhythm and normal heart sounds.   Pulmonary/Chest: Effort normal and breath sounds normal. No respiratory distress.  Neurological: She is alert and oriented to person, place, and time. She has normal strength. Coordination normal.  Skin: Skin is warm and dry. No rash noted. She is not diaphoretic.  Psychiatric: She has a normal mood and affect. Judgment normal.  Nursing note and vitals reviewed.   ED Course  Procedures (including critical care time) Labs Review Labs Reviewed  POCT URINALYSIS DIP (DEVICE) - Abnormal; Notable for the following:    Glucose, UA 100 (*)    Bilirubin Urine SMALL (*)    Protein, ur 30 (*)    Leukocytes, UA SMALL (*)    All other components within normal limits  POCT PREGNANCY, URINE - Abnormal; Notable for the following:    Preg Test, Ur POSITIVE (*)    All other components within normal limits  URINE CULTURE    Imaging Review No results found.   MDM   1. Uveitis   2. Pregnancy   3. 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 I will call the on-call ophthalmologist, he will meet this patient at his office tomorrow morning at 10 AM. The ophthalmologist recommended stopping the prednisone drops and continuing the atropine drops. The patient says she will make this appointment.  I will call on-call OB/GYN, it is safe for this patient to continue the prednisone per their recommendations. She will make an appointment to [facility].her urinalysis did show leukocytes,  will cover with Macrobid. Urine culture sent   Meds ordered this encounter  Medications  . tetracaine (PONTOCAINE) 0.5 % ophthalmic solution 2 drop    Sig:   . nitrofurantoin, macrocrystal-monohydrate, (MACROBID) 100 MG capsule    Sig: Take 1 capsule (100 mg total) by mouth 2 (two) times daily.    Dispense:  10 capsule    Refill:  0    Order Specific Question:  Supervising Provider    Answer:  COREY, Lynne Leader44LyndonvillecharyLiam Graham11/07/15 1836

## 2014-03-21 NOTE — Discharge Instructions (Signed)
Use the atropine drops 4 times daily. Follow-up in Dr. Romeo Apple office at 10 AM tomorrow morning. Call the provided phone number at 9 AM to confirm that you will make the appointment. Schedule a follow-up appointment with the obstetrician as soon as possible. I spoke to the obstetrician on the phone, they said it is fine to continue the prednisone.    First Trimester of Pregnancy The first trimester of pregnancy is from week 1 until the end of week 12 (months 1 through 3). During this time, your baby will begin to develop inside you. At 6-8 weeks, the eyes and face are formed, and the heartbeat can be seen on ultrasound. At the end of 12 weeks, all the baby's organs are formed. Prenatal care is all the medical care you receive before the birth of your baby. Make sure you get good prenatal care and follow all of your doctor's instructions. HOME CARE  Medicines  Take medicine only as told by your doctor. Some medicines are safe and some are not during pregnancy.  Take your prenatal vitamins as told by your doctor.  Take medicine that helps you poop (stool softener) as needed if your doctor says it is okay. Diet  Eat regular, healthy meals.  Your doctor will tell you the amount of weight gain that is right for you.  Avoid raw meat and uncooked cheese.  If you feel sick to your stomach (nauseous) or throw up (vomit):  Eat 4 or 5 small meals a day instead of 3 large meals.  Try eating a few soda crackers.  Drink liquids between meals instead of during meals.  If you have a hard time pooping (constipation):  Eat high-fiber foods like fresh vegetables, fruit, and whole grains.  Drink enough fluids to keep your pee (urine) clear or pale yellow. Activity and Exercise  Exercise only as told by your doctor. Stop exercising if you have cramps or pain in your lower belly (abdomen) or low back.  Try to avoid standing for long periods of time. Move your legs often if you must stand in one  place for a long time.  Avoid heavy lifting.  Wear low-heeled shoes. Sit and stand up straight.  You can have sex unless your doctor tells you not to. Relief of Pain or Discomfort  Wear a good support bra if your breasts are sore.  Take warm water baths (sitz baths) to soothe pain or discomfort caused by hemorrhoids. Use hemorrhoid cream if your doctor says it is okay.  Rest with your legs raised if you have leg cramps or low back pain.  Wear support hose if you have puffy, bulging veins (varicose veins) in your legs. Raise (elevate) your feet for 15 minutes, 3-4 times a day. Limit salt in your diet. Prenatal Care  Schedule your prenatal visits by the twelfth week of pregnancy.  Write down your questions. Take them to your prenatal visits.  Keep all your prenatal visits as told by your doctor. Safety  Wear your seat belt at all times when driving.  Make a list of emergency phone numbers. The list should include numbers for family, friends, the hospital, and police and fire departments. General Tips  Ask your doctor for a referral to a local prenatal class. Begin classes no later than at the start of month 6 of your pregnancy.  Ask for help if you need counseling or help with nutrition. Your doctor can give you advice or tell you where to go for help.  Do not use hot tubs, steam rooms, or saunas.  Do not douche or use tampons or scented sanitary pads.  Do not cross your legs for long periods of time.  Avoid litter boxes and soil used by cats.  Avoid all smoking, herbs, and alcohol. Avoid drugs not approved by your doctor.  Visit your dentist. At home, brush your teeth with a soft toothbrush. Be gentle when you floss. GET HELP IF:  You are dizzy.  You have mild cramps or pressure in your lower belly.  You have a nagging pain in your belly area.  You continue to feel sick to your stomach, throw up, or have watery poop (diarrhea).  You have a bad smelling fluid  coming from your vagina.  You have pain with peeing (urination).  You have increased puffiness (swelling) in your face, hands, legs, or ankles. GET HELP RIGHT AWAY IF:   You have a fever.  You are leaking fluid from your vagina.  You have spotting or bleeding from your vagina.  You have very bad belly cramping or pain.  You gain or lose weight rapidly.  You throw up blood. It may look like coffee grounds.  You are around people who have [name] measles, fifth disease, or chickenpox.  You have a very bad headache.  You have shortness of breath.  You have any kind of trauma, such as from a fall or a car accident. Document Released: 10/18/2007 Document Revised: 09/15/2013 Document Reviewed: 03/11/2013 [facility] Patient Information 2015 [place], [place]. This information is not intended to replace advice given to you by your health care provider. Make sure you discuss any questions you have with your health care provider.  Uveitis The uveal tract (uvea) is a layer of the eye made up of three structures:   The choroid - a layer containing the eye's blood vessels located between the outer white part of the eyeball (sclera) and the inner layer (retina).  The iris - the colored portion of the eye.  The ciliary body - a area of muscle at the base of the iris that helps focus the lens. Uveitis happens when any or all portions of the uveal tract become inflamed.   Iritis is when the iris becomes inflamed. It is the most common form of uveitis. It is extremely important to treat iritis early, as it may lead to internal eye damage causing scarring or diseases such as glaucoma. Some people have only one attack of iritis (in one or both eyes) in their lifetime, while others may get it many times.  Pars planitis is when the ciliary body becomes inflamed.  Choroiditis is when the choroid layer becomes inflamed. If the inflammation also involves the retina, it is called  chorioretinitis.  Panuveitis is when inflammation affects all of the structures of the uveal tract. CAUSES   Diseases where the body's immune system attacks tissues within your own body (autoimmune diseases).  Infections (tuberculosis, gonorrhea, fungus infections, Lyme disease, infection of the lining of the heart).  Trauma or injury.  Eye diseases (acute glaucoma and others).  Inflammation from other parts of the uveal track.  Severe eye infections.  Other rare diseases. SYMPTOMS  Iritis  Eye pain or aching.  Light sensitivity.  Loss of sight or blurred vision.  Redness of the eye. This is often accompanied by a ring of redness around the outside of the cornea or clear covering at the front of the eye (ciliary flush).  Excessive tearing of the eye(s).  A small pupil that does not enlarge in the dark and stays smaller than the other eye's pupil.  A whitish area that obscures the lower part of the colored iris. Sometimes this is visible when looking at the eye. Since iritis causes the eye to become red, it is often confused with a much less dangerous form of "pink eye" or conjunctivitis. One of the most important symptoms is sensitivity to light. Anytime there is redness, discomfort in the eye(s) and extreme light sensitivity, it is extremely important to see an ophthalmologist as soon as possible. Pars Planitis  Mild, progressive drop in vision.  Floating spots in the field of vision in front of the eye.  Development of a cataract. Choroiditis  May have no symptoms.  Progressive drop in vision.  Loss of vision in an isolated area of peripheral vision (scotoma) or to the side.  Sore, aching eye. TREATMENT  Iritis  Corticosteroid eye drops and dilating agent eye drops are often given. Corticosteroid eye drops are used to decrease inflammation. Dilating drops help to prevent scarring of the iris and the risk of the iris becoming "stuck" to the front surface of the  lens. Follow your caregivers exact instructions on taking and stopping corticosteroid medications (drops or pills).  Sometimes, the iritis will be so severe that it will not respond to commonly used medications. If this happens, it may be necessary to use steroid injections. The injections are given under the eye's outer surface. Sometimes oral medications are given. Treatment used for iritis is usually made on an individual basis. Pars Planitis  If mild, no treatment may be required  Corticosteroids are usually given orally or for severe cases, injections are given under the surface of the eye. Choroiditis  If associated with an underlying disease, the disease itself is treated.  Corticosteroids are usually administered by mouth (orally).  Corticosteroid injections or other medicines specific are given to treat the cause of infection or inflammation. These injections are given into the interior cavity of the eye (intravitreal injection).  If caused by a parasite or worm that can be seen by the ophthalmologists, laser treatments may be used to kill the parasite. Panuveitis  Treatment depends upon the cause and severity of the inflammation. It may consist of any one, or a combination of the treatments outlined above. HOME CARE INSTRUCTIONS  Your caregiver will give specific instructions regarding the use of eye medications or other medications. Follow all instructions in both taking and stopping the medications. SEEK IMMEDIATE MEDICAL CARE IF:  You have redness of one or both eyes.  You experience a great deal of light sensitivity.  You have pain or aching in either eye.  You notice a drop in vision in either eye. MAKE SURE YOU:   Understand these instructions.  Will watch your condition.  Will get help right away if you are not doing well or get worse. Document Released: 07/28/2008 Document Revised: 07/24/2011 Document Reviewed: 07/28/2008 [facility] Patient Information 2015  [place], [place]. This information is not intended to replace advice given to you by your health care provider. Make sure you discuss any questions you have with your health care provider.  Urinary Tract Infection Urinary tract infections (UTIs) can develop anywhere along your urinary tract. Your urinary tract is your body's drainage system for removing wastes and extra water. Your urinary tract includes two kidneys, two ureters, a bladder, and a urethra. Your kidneys are a pair of bean-shaped organs. Each kidney is about the size of  your fist. They are located below your ribs, one on each side of your spine. CAUSES Infections are caused by microbes, which are microscopic organisms, including fungi, viruses, and bacteria. These organisms are so small that they can only be seen through a microscope. Bacteria are the microbes that most commonly cause UTIs. SYMPTOMS  Symptoms of UTIs may vary by age and gender of the patient and by the location of the infection. Symptoms in young women typically include a frequent and intense urge to urinate and a painful, burning feeling in the bladder or urethra during urination. Older women and men are more likely to be tired, shaky, and weak and have muscle aches and abdominal pain. A fever may mean the infection is in your kidneys. Other symptoms of a kidney infection include pain in your back or sides below the ribs, nausea, and vomiting. DIAGNOSIS To diagnose a UTI, your caregiver will ask you about your symptoms. Your caregiver also will ask to provide a urine sample. The urine sample will be tested for bacteria and white blood cells. White blood cells are made by your body to help fight infection. TREATMENT  Typically, UTIs can be treated with medication. Because most UTIs are caused by a bacterial infection, they usually can be treated with the use of antibiotics. The choice of antibiotic and length of treatment depend on your symptoms and the type of bacteria causing  your infection. HOME CARE INSTRUCTIONS  If you were prescribed antibiotics, take them exactly as your caregiver instructs you. Finish the medication even if you feel better after you have only taken some of the medication.  Drink enough water and fluids to keep your urine clear or pale yellow.  Avoid caffeine, tea, and carbonated beverages. They tend to irritate your bladder.  Empty your bladder often. Avoid holding urine for long periods of time.  Empty your bladder before and after sexual intercourse.  After a bowel movement, women should cleanse from front to back. Use each tissue only once. SEEK MEDICAL CARE IF:   You have back pain.  You develop a fever.  Your symptoms do not begin to resolve within 3 days. SEEK IMMEDIATE MEDICAL CARE IF:   You have severe back pain or lower abdominal pain.  You develop chills.  You have nausea or vomiting.  You have continued burning or discomfort with urination. MAKE SURE YOU:   Understand these instructions.  Will watch your condition.  Will get help right away if you are not doing well or get worse. Document Released: 02/08/2005 Document Revised: 10/31/2011 Document Reviewed: 06/09/2011 [facility] Patient Information 2015 [place], [place]. This information is not intended to replace advice given to you by your health care provider. Make sure you discuss any questions you have with your health care provider.

## 2014-03-21 NOTE — ED Notes (Signed)
C/o right pink eye onset 2 weeks Sx include: redness, painful, watery Also c/o UTI sx onset 6 months Sx include: dysuria, foul urine odor LMP = 02/01/2014 Alert, no signs of acute distress.

## 2014-03-21 NOTE — ED Notes (Signed)
Has been adv to f/u w/ophthal tomorrow Sunday 03/21/14 at 10:00am Pt verbalized understanding.

## 2014-03-22 ENCOUNTER — Telehealth: Payer: Self-pay | Admitting: Gastroenterology

## 2014-03-22 NOTE — Telephone Encounter (Signed)
Pt relates that she just learned that she is pregnant and wants to discuss mgmt of her Crohn's during pregnancy. No acute problems today. Advised to discuss further with her primary GI during regular office hours.

## 2014-03-23 LAB — URINE CULTURE: Colony Count: >=100000

## 2014-03-24 ENCOUNTER — Telehealth: Payer: Self-pay | Admitting: Internal Medicine

## 2014-03-24 NOTE — Telephone Encounter (Signed)
Pt was seen at urgent care for UTI and Eye infection. Pt states her eye is a little better but she has not started on UTI med yet. Discussed with pt that she needed to get started on the medicine for the UTI now. Pt also reports that she just found out she is pregnant and in her 1st trimester. Pt wants to know what Dr. Hilarie Fredrickson feels she needs to do regarding her treatment for crohn's and the pregnancy. Pt knows Dr. Hilarie Fredrickson is out of the office and will return 03/31/14. Dr. Hilarie Fredrickson please advise.

## 2014-03-30 NOTE — Telephone Encounter (Signed)
Pt scheduled to see Amy Esterwood PA 04/06/14@2pm . Pt aware of appt.

## 2014-03-30 NOTE — Telephone Encounter (Signed)
Spoke with pt and offered her an appt for today. Pt states she cannot come today but that she could come Friday. Do you want her to see midlevel? Please advise.

## 2014-03-30 NOTE — Telephone Encounter (Signed)
She should schedule an office visit to discuss, usually we continue Crohn's therapy during pregnancy due to the risk of flaring during the immune system changes that occur with pregnancy I do think we need to move forward with Cimzia in an effort to better control her IBD and get her off steroids.

## 2014-03-30 NOTE — Telephone Encounter (Signed)
Probably best that she see me, or APP while I'm in the office Cimzia will remain my recommendation during pregnancy given recent active crohn's colitis

## 2014-04-01 ENCOUNTER — Telehealth: Payer: Self-pay

## 2014-04-01 NOTE — Telephone Encounter (Signed)
Received call from [place], Elmyra Ricks, asking if pt should go ahead and have her Cimzia prior to her appt 04/06/14 or wait until she is seen. Spoke with Dr. Hilarie Fredrickson and he would like to wait until pt is seen. Nicole aware.

## 2014-04-06 ENCOUNTER — Ambulatory Visit (INDEPENDENT_AMBULATORY_CARE_PROVIDER_SITE_OTHER): Payer: Medicaid Other | Admitting: Physician Assistant

## 2014-04-06 ENCOUNTER — Encounter: Payer: Self-pay | Admitting: Physician Assistant

## 2014-04-06 ENCOUNTER — Telehealth: Payer: Self-pay

## 2014-04-06 VITALS — BP 108/68 | HR 76 | Ht 65.5 in | Wt 242.2 lb

## 2014-04-06 DIAGNOSIS — K5080 Crohn's disease of both small and large intestine without complications: Secondary | ICD-10-CM

## 2014-04-06 DIAGNOSIS — O0001 Abdominal pregnancy with intrauterine pregnancy: Secondary | ICD-10-CM

## 2014-04-06 DIAGNOSIS — O000 Abdominal pregnancy: Secondary | ICD-10-CM

## 2014-04-06 MED ORDER — PREDNISONE 20 MG PO TABS: ORAL_TABLET | ORAL | Status: DC

## 2014-04-06 NOTE — Telephone Encounter (Signed)
Called and spoke with Darrold Span [facility] nursing) [phone] and let her know per Nicoletta Ba PA pt may start on Cimzia next week. Elmyra Ricks verbalized understanding.

## 2014-04-06 NOTE — Patient Instructions (Signed)
Start Ciamzia 400 mg.  Continue  Continue the Prednisone 20 mg once daily for 2 weeks,  Decrease to 10 mg (1 1/2 tab daily) until you come back to the office.l  Follow up with Dr. Hilarie Fredrickson in about 4 weeks.  05-05-2014 at 11:00 am .

## 2014-04-06 NOTE — Progress Notes (Addendum)
Patient ID: Julie Mcconnell, female   DOB: 1985/08/13, 28 y.o.   MRN: [id number]   Subjective:    Patient ID: Julie Mcconnell, female    DOB: [birth year], 28 y.o.   MRN: [id number]  HPI Julie Mcconnell is a 27 year old African-American female known to Dr.Pyrtle with history of Crohn's ileocolitis. 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 Was started on Remicade and after her last induction dose in July had an infusion reaction . Remicade was discontinued. Unfortunately she missed several follow-up appointments and then came to see [name]  in mid October. She had continued apparently on prednisone at a low dose however exact dose is unclear. At  that time she was complaining of increased abdominal discomfort and diarrhea. It was felt that she should be initiated on Cimzia , as her injections could be supervised. At that time she was asked to take prednisone 40 mg daily 7 days then decrease by 5 mg every week until off. Quantiferon  Gold has been done and is negative, she recieved influenza vaccine at her last visit ,and  comes in today for follow-up. She states that Cimzia has been shipped to her home but she has not started as yet. She has been taking prednisone 20 mg once daily, though she seems somewhat haphazard with her dosing. She learned that she was pregnant about a week ago and has not yet seen a gynecologist. She is to be referred by: Urgent care. She says she has had some intermittent nausea and has had some weakness and fatigue after eating over the past couple of weeks. She really has no complaints of ongoing abdominal pain at this time, no current diarrhea and has not been seeing any blood. She was recently treated for a UTI and also had developed inflammation in her right eye. This has  been diagnosed as uveitis and she's on steroid drops per ophthalmologist. She has follow-up later this week .She  says her eye is doing much better  Review of Systems Pertinent  positive and negative review of systems were noted in the above HPI section.  All other review of systems was otherwise negative.  Outpatient Encounter Prescriptions as of 04/06/2014  Medication Sig  . hyoscyamine (LEVSIN SL) 0.125 MG SL tablet 1-2 tablets every 6 hours as needed for Abdominal pain and cramping  . MOVIPREP 100 G SOLR Take 1 kit (200 g total) by mouth once.  . [DISCONTINUED] nitrofurantoin, macrocrystal-monohydrate, (MACROBID) 100 MG capsule Take 1 capsule (100 mg total) by mouth 2 (two) times daily.  . [DISCONTINUED] predniSONE (DELTASONE) 10 MG tablet Take 4 tablets (40 mg total) by mouth daily with breakfast.  . [DISCONTINUED] predniSONE (DELTASONE) 10 MG tablet 54m for one week, 368mfor one week, 3047mor one week, 71m48mr one week, 20mg70m one week, 15mg 62mone week, 10mg f69mne week.  . [DISCONTINUED] predniSONE (DELTASONE) 10 MG tablet Take as directed  . predniSONE (DELTASONE) 20 MG tablet Take 20 mg ( 1 tablet) for 14 days then go to 1/2 tab ( 10 mg )  Daily until you come back to see Dr. Pyrtle.Hilarie Fredricksonergies  Allergen Reactions  . Remicade [Infliximab]     Chest pain and wheezing   Patient Active Problem List   Diagnosis Date Noted  . Crohn's ileocolitis 07/22/2013  . Condyloma acuminatum in female 07/22/2013  . Maternal anemia complicating pregnancy, childbirth, or the puerperium 02/02/2011  . Normal delivery 02/01/2011  . Active  labor 01/31/2011  . History of VBAC 01/31/2011   History   Social History  . Marital Status: Single    Spouse Name: N/A    Number of Children: N/A  . Years of Education: N/A   Occupational History  . Not on file.   Social History Main Topics  . Smoking status: Current Every Day Smoker -- 1.50 packs/day for 6 years    Types: Cigarettes  . Smokeless tobacco: Never Used  . Alcohol Use: 0.6 oz/week    1 Cans of beer per week  . Drug Use: No  . Sexual Activity: Yes   Other Topics Concern  . Not on file   Social  History Narrative    Ms. Gathright's family history includes Heart disease in her maternal grandmother.      Objective:    Filed Vitals:   04/06/14 1411  BP: 108/68  Pulse: 76    Physical Exam  well-developed African-American female, obese, in no acute distress pleasant accompanied by 2 small children height 5 foot 5 weight 242. HEENT ;nontraumatic normocephalic EOMI PERRLA sclera anicteric, Cardiovascular; regular rate and rhythm with S1-S2 no murmur or gallop, Pulmonary; clear bilaterally, Abdomen ;soft and basically nontender nondistended bowel sounds are active there is no palpable mass or hepatosplenomegaly, Rectal; exam not done, Extremities ;no clubbing cyanosis or edema skin warm and dry, Psych ;mood and affect appropriate     Assessment & Plan:   #18  28 year old African-American female with active Crohn's ileocolitis. Patient has been on steroids over the past several months. Some issues with compliance #2 early pregnancy #3 uveitis right eye-improving  Plan; patient is asked to take 20 mg of prednisone once daily 2 weeks then decrease to 10 mg by mouth daily 2 weeks then stop prednisone Proceed with Cimzia-will arrange for nurse to give her the  first injection early next week, to be followed by second induction dose in 2 weeks. Patient has active Crohn's, and goal is to be able to get her off of steroids.  She will follow up in the office in 4 weeks  Amy Genia Harold PA-C 04/06/2014   Addendum: Reviewed and agree with initial management. Pt is pregnant but as active Crohn's. Agree with initiation of Cimzia therapy and weaning off steroids ASAP.  We have reviewed the risks and benefits of biologic therapy on multiple occasions and she is agreeable to proceed.   Jerene Bears, MD

## 2014-04-21 ENCOUNTER — Telehealth: Payer: Self-pay | Admitting: Internal Medicine

## 2014-04-21 NOTE — Telephone Encounter (Signed)
Letter mailed to pt as per her request.

## 2014-04-27 ENCOUNTER — Telehealth: Payer: Self-pay | Admitting: Internal Medicine

## 2014-04-27 NOTE — Telephone Encounter (Signed)
Left message for pt to call back  °

## 2014-04-28 NOTE — Telephone Encounter (Signed)
MiraLax 17g daily can be used and she also needs to clear this with her OB MD

## 2014-04-28 NOTE — Telephone Encounter (Signed)
Pt calling wanting to know what she can take for constipation. Suggested pt call her OB doc to see what they suggest but pt states she does not have an appointment with the OB doc until January. Please advise.

## 2014-04-29 NOTE — Telephone Encounter (Signed)
Patient received Cimzia injection from nurse on 04/28/14.

## 2014-04-29 NOTE — Telephone Encounter (Signed)
Pt aware.

## 2014-05-01 ENCOUNTER — Telehealth: Payer: Self-pay | Admitting: *Deleted

## 2014-05-01 ENCOUNTER — Telehealth: Payer: Self-pay | Admitting: Internal Medicine

## 2014-05-01 NOTE — Telephone Encounter (Signed)
Patient in need of a NOB appointment. Patient is an established patient of Dr.Harper's and has been scheduled for 06-03-14 @ 10:30.

## 2014-05-01 NOTE — Telephone Encounter (Signed)
Let pt know she did need to keep her appt as it is a 4 week follow-up visit.

## 2014-05-05 ENCOUNTER — Ambulatory Visit: Payer: Medicaid Other | Admitting: Internal Medicine

## 2014-05-05 ENCOUNTER — Telehealth: Payer: Self-pay | Admitting: Internal Medicine

## 2014-05-05 NOTE — Telephone Encounter (Signed)
I do not want to charge her because I think she needs to be seen for her Crohn's disease Follow-up should be scheduled in the next available slot She will need to keep appointments in order for me to continue caring for her and writing her biologic medication to control her IBD

## 2014-05-28 ENCOUNTER — Ambulatory Visit: Payer: Medicaid Other | Admitting: Internal Medicine

## 2014-06-03 ENCOUNTER — Encounter: Payer: Self-pay | Admitting: Obstetrics

## 2014-06-03 ENCOUNTER — Ambulatory Visit (INDEPENDENT_AMBULATORY_CARE_PROVIDER_SITE_OTHER): Payer: Medicaid Other | Admitting: Obstetrics

## 2014-06-03 VITALS — BP 125/84 | HR 95 | Temp 99.6°F | Wt 237.0 lb

## 2014-06-03 DIAGNOSIS — O219 Vomiting of pregnancy, unspecified: Secondary | ICD-10-CM

## 2014-06-03 DIAGNOSIS — K219 Gastro-esophageal reflux disease without esophagitis: Secondary | ICD-10-CM

## 2014-06-03 DIAGNOSIS — Z3481 Encounter for supervision of other normal pregnancy, first trimester: Secondary | ICD-10-CM

## 2014-06-03 LAB — POCT URINALYSIS DIPSTICK
BILIRUBIN UA: NEGATIVE
Glucose, UA: NEGATIVE
LEUKOCYTES UA: NEGATIVE
NITRITE UA: NEGATIVE
PH UA: 6
RBC UA: NEGATIVE
Spec Grav, UA: 1.02
Urobilinogen, UA: 4

## 2014-06-03 MED ORDER — OMEPRAZOLE 20 MG PO CPDR: 20.0000 mg | DELAYED_RELEASE_CAPSULE | Freq: Every day | ORAL | Status: DC

## 2014-06-03 MED ORDER — CITRANATAL HARMONY 27-1-260 MG PO CAPS: 1.0000 | ORAL_CAPSULE | Freq: Every day | ORAL | Status: DC

## 2014-06-03 MED ORDER — PROMETHAZINE HCL 25 MG PO TABS: 25.0000 mg | ORAL_TABLET | Freq: Four times a day (QID) | ORAL | Status: DC | PRN

## 2014-06-03 NOTE — Progress Notes (Signed)
Subjective:    Julie Mcconnell is being seen today for her first obstetrical visit.  This is not a planned pregnancy. She is at 76w3dgestation. Her obstetrical history is significant for obesity and smoker. Relationship with FOB: significant other, not living together. Patient does not intend to breast feed. Pregnancy history fully reviewed.  The information documented in the HPI was reviewed and verified.  Menstrual History: OB History    Gravida Para Term Preterm AB TAB SAB Ectopic Multiple Living   5 4 4  0 0 0 0 0 0 4      Patient's last menstrual period was 02/01/2014.    Past Medical History  Diagnosis Date  . History of chronic bronchitis   . Crohn's disease   . GERD (gastroesophageal reflux disease)   . History of concussion     child--  no residual  . Bipolar disorder     Past Surgical History  Procedure Laterality Date  . Cesarean section  01-26-2005  . Inner ear surgery Right as child  . Umbilical hernia repair  age 29 . Tonsillectomy  as child  . Laser ablation condolamata N/A 08/25/2013    Procedure: LASER ABLATION CONDOLAMATA/ANAL EXAM UNDER ANESTHESIA;  Surgeon: ALeighton Ruff MD;  Location: [facility]  Service: General;  Laterality: N/A;     (Not in a hospital admission) Allergies  Allergen Reactions  . Remicade [Infliximab]     Chest pain and wheezing    History  Substance Use Topics  . Smoking status: Current Every Day Smoker -- 1.50 packs/day for 6 years    Types: Cigarettes  . Smokeless tobacco: Never Used  . Alcohol Use: 0.6 oz/week    1 Cans of beer per week     Comment: From time to time even during pregnancy; patient states she last had a drink 06-02-14    Family History  Problem Relation Age of Onset  . Heart disease Maternal Grandmother   . Hypertension Mother      Review of Systems Constitutional: negative for weight loss Gastrointestinal: negative for vomiting Genitourinary:negative for genital lesions and vaginal  discharge and dysuria Musculoskeletal:negative for back pain Behavioral/Psych: negative for abusive relationship, depression, illegal drug usage and tobacco use    Objective:    BP 125/84 mmHg  Pulse 95  Temp(Src) 99.6 F (37.6 C)  Wt 237 lb (107.502 kg)  LMP 02/01/2014 General Appearance:    Alert, cooperative, no distress, appears stated age  Head:    Normocephalic, without obvious abnormality, atraumatic  Eyes:    PERRL, conjunctiva/corneas clear, EOM's intact, fundi    benign, both eyes  Ears:    Normal TM's and external ear canals, both ears  Nose:   Nares normal, septum midline, mucosa normal, no drainage    or sinus tenderness  Throat:   Lips, mucosa, and tongue normal; teeth and gums normal  Neck:   Supple, symmetrical, trachea midline, no adenopathy;    thyroid:  no enlargement/tenderness/nodules; no carotid   bruit or JVD  Back:     Symmetric, no curvature, ROM normal, no CVA tenderness  Lungs:     Clear to auscultation bilaterally, respirations unlabored  Chest Wall:    No tenderness or deformity   Heart:    Regular rate and rhythm, S1 and S2 normal, no murmur, rub   or gallop  Breast Exam:    No tenderness, masses, or nipple abnormality  Abdomen:     Soft, non-tender, bowel sounds active all  four quadrants,    no masses, no organomegaly  Genitalia:    Normal female without lesion, discharge or tenderness  Extremities:   Extremities normal, atraumatic, no cyanosis or edema  Pulses:   2+ and symmetric all extremities  Skin:   Skin color, texture, turgor normal, no rashes or lesions  Lymph nodes:   Cervical, supraclavicular, and axillary nodes normal  Neurologic:   CNII-XII intact, normal strength, sensation and reflexes    throughout      Lab Review Urine pregnancy test Labs reviewed yes Radiologic studies reviewed yes Assessment:    Pregnancy at 46w3dweeks    Plan:      Prenatal vitamins.  Counseling provided regarding continued use of seat belts,  cessation of alcohol consumption, smoking or use of illicit drugs; infection precautions i.e., influenza/TDAP immunizations, toxoplasmosis,CMV, parvovirus, listeria and varicella; workplace safety, exercise during pregnancy; routine dental care, safe medications, sexual activity, hot tubs, saunas, pools, travel, caffeine use, fish and methlymercury, potential toxins, hair treatments, varicose veins Weight gain recommendations per IOM guidelines reviewed: underweight/BMI< 18.5--> gain 28 - 40 lbs; normal weight/BMI 18.5 - 24.9--> gain 25 - 35 lbs; overweight/BMI 25 - 29.9--> gain 15 - 25 lbs; obese/BMI >30->gain  11 - 20 lbs Problem list reviewed and updated. FIRST/CF mutation testing/NIPT/QUAD SCREEN/fragile X/Ashkenazi Jewish population testing/Spinal muscular atrophy discussed: requested. Role of ultrasound in pregnancy discussed; fetal survey: requested. Amniocentesis discussed: not indicated. VBAC calculator score: VBAC consent form provided Meds ordered this encounter  Medications  . Certolizumab Pegol 2 X 200 MG/ML KIT    Sig: Inject into the skin.   Orders Placed This Encounter  Procedures  . Culture, OB Urine  . Obstetric panel  . HIV antibody  . Hemoglobinopathy evaluation  . Varicella zoster antibody, IgG  . TSH  . Vit D  25 hydroxy (rtn osteoporosis monitoring)  . POCT urinalysis dipstick    Follow up in 4 weeks.

## 2014-06-04 ENCOUNTER — Encounter: Payer: Self-pay | Admitting: Obstetrics

## 2014-06-04 LAB — OBSTETRIC PANEL
Antibody Screen: NEGATIVE
BASOS ABS: 0 10*3/uL (ref 0.0–0.1)
BASOS PCT: 0 % (ref 0–1)
EOS PCT: 3 % (ref 0–5)
Eosinophils Absolute: 0.2 10*3/uL (ref 0.0–0.7)
HEMATOCRIT: 37.2 % (ref 36.0–46.0)
Hemoglobin: 12.3 g/dL (ref 12.0–15.0)
Hepatitis B Surface Ag: NEGATIVE
LYMPHS ABS: 2.3 10*3/uL (ref 0.7–4.0)
Lymphocytes Relative: 32 % (ref 12–46)
MCH: 30.6 pg (ref 26.0–34.0)
MCHC: 33.1 g/dL (ref 30.0–36.0)
MCV: 92.5 fL (ref 78.0–100.0)
MONOS PCT: 14 % — AB (ref 3–12)
MPV: 9.3 fL (ref 8.6–12.4)
Monocytes Absolute: 1 10*3/uL (ref 0.1–1.0)
Neutro Abs: 3.7 10*3/uL (ref 1.7–7.7)
Neutrophils Relative %: 51 % (ref 43–77)
PLATELETS: 284 10*3/uL (ref 150–400)
RBC: 4.02 MIL/uL (ref 3.87–5.11)
RDW: 15.4 % (ref 11.5–15.5)
RUBELLA: 3.21 {index} — AB (ref <0.90)
Rh Type: POSITIVE
WBC: 7.2 10*3/uL (ref 4.0–10.5)

## 2014-06-04 LAB — PAP IG W/ RFLX HPV ASCU

## 2014-06-04 LAB — VITAMIN D 25 HYDROXY (VIT D DEFICIENCY, FRACTURES): Vit D, 25-Hydroxy: 8 ng/mL — ABNORMAL LOW (ref 30–100)

## 2014-06-04 LAB — TSH: TSH: 0.416 u[IU]/mL (ref 0.350–4.500)

## 2014-06-04 LAB — HIV ANTIBODY (ROUTINE TESTING W REFLEX): HIV 1&2 Ab, 4th Generation: NONREACTIVE

## 2014-06-04 LAB — VARICELLA ZOSTER ANTIBODY, IGG: Varicella IgG: 685.3 Index — ABNORMAL HIGH (ref <135.00)

## 2014-06-05 LAB — HEMOGLOBINOPATHY EVALUATION
HGB S QUANTITAION: 0 %
Hemoglobin Other: 0 %
Hgb A2 Quant: 2.4 % (ref 2.2–3.2)
Hgb A: 97.6 % (ref 96.8–97.8)
Hgb F Quant: 0 % (ref 0.0–2.0)

## 2014-06-05 LAB — CULTURE, OB URINE
Colony Count: NO GROWTH
Organism ID, Bacteria: NO GROWTH

## 2014-06-07 LAB — SURESWAB, VAGINOSIS/VAGINITIS PLUS
ATOPOBIUM VAGINAE: NOT DETECTED Log (cells/mL)
C. ALBICANS, DNA: NOT DETECTED
C. TROPICALIS, DNA: NOT DETECTED
C. glabrata, DNA: NOT DETECTED
C. parapsilosis, DNA: NOT DETECTED
C. trachomatis RNA, TMA: NOT DETECTED
GARDNERELLA VAGINALIS: NOT DETECTED Log (cells/mL)
LACTOBACILLUS SPECIES: >8 Log (cells/mL)
MEGASPHAERA SPECIES: NOT DETECTED Log (cells/mL)
N. gonorrhoeae RNA, TMA: NOT DETECTED
T. VAGINALIS RNA, QL TMA: NOT DETECTED

## 2014-06-17 ENCOUNTER — Other Ambulatory Visit: Payer: Self-pay | Admitting: *Deleted

## 2014-06-17 MED ORDER — OB COMPLETE PETITE 35-5-1-200 MG PO CAPS: 1.0000 | ORAL_CAPSULE | Freq: Every day | ORAL | Status: DC

## 2014-06-17 NOTE — Progress Notes (Signed)
Pt made aware of lab results, low vitamin d. Pt request PNV with recommended vit d level be sent to pharmacy.

## 2014-06-24 ENCOUNTER — Other Ambulatory Visit: Payer: Self-pay | Admitting: Obstetrics

## 2014-06-24 ENCOUNTER — Other Ambulatory Visit: Payer: Medicaid Other

## 2014-06-24 ENCOUNTER — Ambulatory Visit (INDEPENDENT_AMBULATORY_CARE_PROVIDER_SITE_OTHER): Payer: Medicaid Other | Admitting: Internal Medicine

## 2014-06-24 ENCOUNTER — Encounter: Payer: Self-pay | Admitting: Internal Medicine

## 2014-06-24 ENCOUNTER — Other Ambulatory Visit: Payer: Self-pay

## 2014-06-24 VITALS — BP 100/60 | HR 92 | Ht 65.5 in | Wt 240.1 lb

## 2014-06-24 DIAGNOSIS — Z79899 Other long term (current) drug therapy: Secondary | ICD-10-CM

## 2014-06-24 DIAGNOSIS — Z1389 Encounter for screening for other disorder: Secondary | ICD-10-CM

## 2014-06-24 DIAGNOSIS — K5080 Crohn's disease of both small and large intestine without complications: Secondary | ICD-10-CM

## 2014-06-24 DIAGNOSIS — O0001 Abdominal pregnancy with intrauterine pregnancy: Secondary | ICD-10-CM

## 2014-06-24 DIAGNOSIS — O000 Abdominal pregnancy: Secondary | ICD-10-CM

## 2014-06-24 MED ORDER — CERTOLIZUMAB PEGOL 2 X 200 MG/ML ~~LOC~~ KIT: PACK | SUBCUTANEOUS | Status: DC

## 2014-06-24 NOTE — Progress Notes (Signed)
Subjective:    Patient ID: Julie Mcconnell, female    DOB: 1985-12-13, 29 y.o.   MRN: [id number]  HPI  Julie Mcconnell is a 29 year old female with ileocolonic Crohn's diagnosed in January 2015 who is also approximate [redacted] weeks pregnant who presents for follow-up. She was started on Cimzia in December after being seen by Julie Ba, PA-C in November 2015. At that time she was requiring steroids for recurrent flare. She also had uveitis. She is here today with her 2  daughters. She reports that she is feeling much better from a colitis standpoint. She's having no abdominal pain. Her bloody diarrhea has resolved. For the most part her stools are formed. She occasionally can have a loose stool. She's having issues with heartburn during pregnancy but was given omeprazole recently by her gynecologist. She's also taking promethazine on an as-needed basis for nausea. No fevers or chills. She is getting Cimzia through the drug assistance nurse who comes to her home for injection. Last injection was last Friday. She has weaned completely off steroids. She has an upcoming ultrasound next week.   Review of Systems  as per history of present illness, otherwise negative  Current Medications, Allergies, Past Medical History, Past Surgical History, Family History and Social History were reviewed in [employer] record.     Objective:   Physical Exam BP 100/60 mmHg  Pulse 92  Ht 5' 5.5" (1.664 m)  Wt 240 lb 2 oz (108.92 kg)  BMI 39.34 kg/m2  LMP 02/01/2014 Constitutional: Well-developed and well-nourished. No distress. Smells of tobacco smoke HEENT: Normocephalic and atraumatic. Oropharynx is clear and moist. No oropharyngeal exudate. Conjunctivae are normal.  No scleral icterus. Neck: Neck supple. Trachea midline. Cardiovascular: Normal rate, regular rhythm and intact distal pulses. No M/R/G Pulmonary/chest: Effort normal and breath sounds normal. No wheezing, rales or  rhonchi. Abdominal: Soft, nontender, nondistended. Bowel sounds active throughout. Uterus palpable just below umbilicus Extremities: no clubbing, cyanosis, or edema Lymphadenopathy: No cervical adenopathy noted. Neurological: Alert and oriented to person place and time. Skin: Skin is warm and dry. No rashes noted. Psychiatric: Normal mood and affect. Behavior is normal.  CBC    Component Value Date/Time   WBC 7.2 06/03/2014 1543   RBC 4.02 06/03/2014 1543   HGB 12.3 06/03/2014 1543   HCT 37.2 06/03/2014 1543   PLT 284 06/03/2014 1543   MCV 92.5 06/03/2014 1543   MCH 30.6 06/03/2014 1543   MCHC 33.1 06/03/2014 1543   RDW 15.4 06/03/2014 1543   LYMPHSABS 2.3 06/03/2014 1543   MONOABS 1.0 06/03/2014 1543   EOSABS 0.2 06/03/2014 1543   BASOSABS 0.0 06/03/2014 1543    CMP     Component Value Date/Time   NA 139 03/03/2014 1202   K 3.8 03/03/2014 1202   CL 105 03/03/2014 1202   CO2 27 03/03/2014 1202   GLUCOSE 114* 03/03/2014 1202   BUN 8 03/03/2014 1202   CREATININE 0.6 03/03/2014 1202   CALCIUM 9.4 03/03/2014 1202   PROT 7.9 03/03/2014 1202   ALBUMIN 3.5 03/03/2014 1202   AST 18 03/03/2014 1202   ALT 21 03/03/2014 1202   ALKPHOS 67 03/03/2014 1202   BILITOT 0.6 03/03/2014 1202   GFRNONAA >90 06/01/2013 1210   GFRAA >90 06/01/2013 1210       Assessment & Plan:  29 year old female with ileocolonic Crohn's diagnosed in January 2015 who is also approximate [redacted] weeks pregnant who presents for follow-up.  1. Ileocolonic Crohn's -- excellent response  to Cimzia. Continue Cimzia 400 mg every 4 weeks. We discussed Crohn's disease and how it relates to her pregnancy. Cimzia typically does not cross the placenta and is felt to be safe during pregnancy and through delivery. Given her ileocolonic Crohn's has required steroids very recently, I would recommend continuing anti-TNF therapy with Cimzia throughout her pregnancy and beyond. She understands this recommendation. We will  continue administration through the home health nurse to help with compliance and administration .  Return in 6 months, sooner if necessary. I asked that she call me should she develop abdominal pain, diarrhea or rectal bleeding and she voices understanding   2. Pregnancy  -- follow with Dr. Jodi Mourning and pregnancy without complications to this point   3. Uveitis  -- has improved to this point continue Cimzia

## 2014-06-24 NOTE — Patient Instructions (Addendum)
Please continue Cimzia every 4 weeks. We have contacted [facility] [phone] to give authorization for 6 more months of refills.  Please follow up with Dr Hilarie Fredrickson in 6 months.  Please continue prenatal care with Dr Jodi Mourning.

## 2014-07-01 ENCOUNTER — Other Ambulatory Visit: Payer: Medicaid Other

## 2014-07-01 ENCOUNTER — Encounter: Payer: Medicaid Other | Admitting: Obstetrics

## 2014-07-07 ENCOUNTER — Other Ambulatory Visit: Payer: Medicaid Other

## 2014-07-07 ENCOUNTER — Ambulatory Visit (INDEPENDENT_AMBULATORY_CARE_PROVIDER_SITE_OTHER): Payer: Medicaid Other

## 2014-07-07 DIAGNOSIS — Z1389 Encounter for screening for other disorder: Secondary | ICD-10-CM

## 2014-07-07 DIAGNOSIS — Z36 Encounter for antenatal screening of mother: Secondary | ICD-10-CM

## 2014-07-13 ENCOUNTER — Encounter: Payer: Medicaid Other | Admitting: Obstetrics

## 2014-07-21 ENCOUNTER — Encounter: Payer: Medicaid Other | Admitting: Obstetrics

## 2014-07-24 ENCOUNTER — Encounter: Payer: Self-pay | Admitting: Obstetrics

## 2014-07-24 ENCOUNTER — Ambulatory Visit (INDEPENDENT_AMBULATORY_CARE_PROVIDER_SITE_OTHER): Payer: Medicaid Other | Admitting: Obstetrics

## 2014-07-24 VITALS — BP 114/75 | HR 86 | Temp 98.4°F | Wt 232.0 lb

## 2014-07-24 DIAGNOSIS — Z3482 Encounter for supervision of other normal pregnancy, second trimester: Secondary | ICD-10-CM

## 2014-07-24 LAB — POCT URINALYSIS DIPSTICK
BILIRUBIN UA: NEGATIVE
Glucose, UA: NEGATIVE
LEUKOCYTES UA: NEGATIVE
NITRITE UA: NEGATIVE
RBC UA: NEGATIVE
Spec Grav, UA: 1.015
UROBILINOGEN UA: NEGATIVE
pH, UA: 6.5

## 2014-07-24 NOTE — Progress Notes (Signed)
Subjective:    Julie Mcconnell is a 29 y.o. female being seen today for her obstetrical visit. She is at 84w5dgestation. Patient reports: nausea and congestion . Fetal movement: normal.  Problem List Items Addressed This Visit    None    Visit Diagnoses    Encounter for supervision of other normal pregnancy in second trimester    -  Primary    Relevant Orders    POCT urinalysis dipstick      Patient Active Problem List   Diagnosis Date Noted  . Crohn's ileocolitis 07/22/2013  . Condyloma acuminatum in female 07/22/2013  . Maternal anemia complicating pregnancy, childbirth, or the puerperium 02/02/2011  . Normal delivery 02/01/2011  . Active labor 01/31/2011  . History of VBAC 01/31/2011   Objective:    BP 114/75 mmHg  Pulse 86  Temp(Src) 98.4 F (36.9 C)  Wt 232 lb (105.235 kg)  LMP 02/01/2014 FHT: 150 BPM  Uterine Size: size equals dates     Assessment:    Pregnancy @ 233w5d  Plan:    OBGCT: ordered.  Labs, problem list reviewed and updated 2 hr GTT planned Follow up in 4 weeks.

## 2014-08-20 ENCOUNTER — Other Ambulatory Visit: Payer: Medicaid Other

## 2014-08-20 ENCOUNTER — Encounter: Payer: Medicaid Other | Admitting: Obstetrics

## 2014-08-21 ENCOUNTER — Other Ambulatory Visit: Payer: Medicaid Other

## 2014-09-08 ENCOUNTER — Telehealth: Payer: Self-pay | Admitting: Internal Medicine

## 2014-09-08 NOTE — Telephone Encounter (Signed)
Discussed with pt that sometimes the food or drink we ingest will change the color of our bowel movements. Pt verbalized understanding.

## 2014-09-18 ENCOUNTER — Telehealth: Payer: Self-pay | Admitting: Obstetrics

## 2014-09-23 ENCOUNTER — Encounter: Payer: Medicaid Other | Admitting: Certified Nurse Midwife

## 2014-09-24 ENCOUNTER — Telehealth: Payer: Self-pay | Admitting: Internal Medicine

## 2014-09-24 NOTE — Telephone Encounter (Signed)
Looks like patient has been getting Cimzia administered by nurse.... Patient states that she is getting the medication from [facility] and she is told medication will arrive on a certain day but it arrives 2-3 days later than stated. Also states that she doesn't like that she has to "sit at home all day and wait around to sign for the medicine." I explained that she was probably made to sign for the medication so that it is insured that the medication is promptly refrigerated after arrival and that this is actually probably good practice. I have asked if she has spoken with [facility] about there concerns and she tells me that she has. Advised that maybe she could use another company to get her medication through. She states she would just like to see about another medication instead. Please advise.Marland KitchenMarland KitchenMarland Kitchen

## 2014-09-25 NOTE — Telephone Encounter (Signed)
Given pregnancy and her crohn's disease I feel that cimzia is the best option for her I strongly recommend she continue this medication with interruption (medication can lose efficacy when doses are missed), keep office followup appts, and do not smoke

## 2014-09-25 NOTE — Telephone Encounter (Signed)
I have spoken to patient to make her aware of Dr Vena Rua recommendations. She verbalizes understanding. States "I will try to keep up with my appointments and do what Im supposed to but Im also not gonna be out here on the streets. I gotta pay other bills."

## 2014-09-28 NOTE — Telephone Encounter (Signed)
[id number] - Patient has appt scheduled for [phone]. brm

## 2014-09-29 ENCOUNTER — Encounter: Payer: Medicaid Other | Admitting: Certified Nurse Midwife

## 2014-09-29 ENCOUNTER — Ambulatory Visit (INDEPENDENT_AMBULATORY_CARE_PROVIDER_SITE_OTHER): Payer: Medicaid Other | Admitting: Obstetrics

## 2014-09-29 VITALS — BP 117/76 | HR 96 | Temp 98.8°F | Wt 238.0 lb

## 2014-09-29 DIAGNOSIS — Z3483 Encounter for supervision of other normal pregnancy, third trimester: Secondary | ICD-10-CM

## 2014-09-29 LAB — POCT URINALYSIS DIPSTICK
Bilirubin, UA: NEGATIVE
Blood, UA: NEGATIVE
Glucose, UA: NEGATIVE
LEUKOCYTES UA: NEGATIVE
NITRITE UA: NEGATIVE
PH UA: 6.5
Spec Grav, UA: 1.015
Urobilinogen, UA: 1

## 2014-09-30 ENCOUNTER — Encounter: Payer: Self-pay | Admitting: Obstetrics

## 2014-09-30 NOTE — Progress Notes (Signed)
Subjective:    Julie Mcconnell is a 29 y.o. female being seen today for her obstetrical visit. She is at 19w3dgestation. Patient reports no complaints. Fetal movement: normal.  Problem List Items Addressed This Visit    None    Visit Diagnoses    Encounter for supervision of other normal pregnancy in third trimester    -  Primary    Relevant Orders    POCT urinalysis dipstick (Completed)    SureSwab, Vaginosis/Vaginitis Plus      Patient Active Problem List   Diagnosis Date Noted  . Crohn's ileocolitis 07/22/2013  . Condyloma acuminatum in female 07/22/2013  . Maternal anemia complicating pregnancy, childbirth, or the puerperium 02/02/2011  . Normal delivery 02/01/2011  . Active labor 01/31/2011  . History of VBAC 01/31/2011   Objective:    BP 117/76 mmHg  Pulse 96  Temp(Src) 98.8 F (37.1 C)  Wt 238 lb (107.956 kg)  LMP 02/01/2014 FHT:  150 BPM  Uterine Size: size equals dates  Presentation: unsure     Assessment:    Pregnancy @ 343w3deeks   Plan:     labs reviewed, problem list updated Consent signed. GBS sent TDAP offered  Rhogam given for RH negative Pediatrician: discussed. Infant feeding: plans to breastfeed. Maternity leave: discussed. Cigarette smoking: smokes 1.5 PPD. Orders Placed This Encounter  Procedures  . SureSwab, Vaginosis/Vaginitis Plus  . POCT urinalysis dipstick   No orders of the defined types were placed in this encounter.   Follow up in 1 Week.

## 2014-10-02 ENCOUNTER — Other Ambulatory Visit: Payer: Self-pay | Admitting: Obstetrics

## 2014-10-02 ENCOUNTER — Telehealth: Payer: Self-pay | Admitting: Internal Medicine

## 2014-10-02 DIAGNOSIS — B373 Candidiasis of vulva and vagina: Secondary | ICD-10-CM

## 2014-10-02 DIAGNOSIS — B3731 Acute candidiasis of vulva and vagina: Secondary | ICD-10-CM

## 2014-10-02 LAB — SURESWAB, VAGINOSIS/VAGINITIS PLUS
ATOPOBIUM VAGINAE: NOT DETECTED Log (cells/mL)
C. PARAPSILOSIS, DNA: NOT DETECTED
C. TRACHOMATIS RNA, TMA: NOT DETECTED
C. albicans, DNA: DETECTED — AB
C. glabrata, DNA: NOT DETECTED
C. tropicalis, DNA: NOT DETECTED
Gardnerella vaginalis: NOT DETECTED Log (cells/mL)
MEGASPHAERA SPECIES: NOT DETECTED Log (cells/mL)
N. GONORRHOEAE RNA, TMA: NOT DETECTED
T. vaginalis RNA, QL TMA: NOT DETECTED

## 2014-10-02 MED ORDER — FLUCONAZOLE 150 MG PO TABS: 150.0000 mg | ORAL_TABLET | Freq: Once | ORAL | Status: DC

## 2014-10-02 NOTE — Telephone Encounter (Signed)
Should be fine.

## 2014-10-02 NOTE — Telephone Encounter (Signed)
Spoke with patient and she talked with her OB/GYN about having a tubular ligation after this pregnancy. She wants to know if this will be ok and will not affect her Crohns.

## 2014-10-02 NOTE — Telephone Encounter (Signed)
Patient notified of recommendation.

## 2014-10-06 ENCOUNTER — Encounter: Payer: Medicaid Other | Admitting: Obstetrics

## 2014-10-14 ENCOUNTER — Encounter: Payer: Medicaid Other | Admitting: Obstetrics

## 2014-10-15 ENCOUNTER — Encounter: Payer: Medicaid Other | Admitting: Obstetrics

## 2014-10-15 ENCOUNTER — Telehealth: Payer: Self-pay | Admitting: *Deleted

## 2014-10-15 NOTE — Telephone Encounter (Signed)
Julie Mcconnell no-showed for her appointment today. I called her at 10:00 am and her phone is not accepting phone calls at this time, per recording.

## 2014-10-16 ENCOUNTER — Telehealth: Payer: Self-pay | Admitting: Obstetrics

## 2014-10-16 NOTE — Telephone Encounter (Signed)
06/038/2016 - close [employer]

## 2014-10-22 ENCOUNTER — Telehealth: Payer: Self-pay | Admitting: *Deleted

## 2014-10-22 NOTE — Telephone Encounter (Signed)
Placed call to pt.  No answer, left message on voicemail to have pt contact office to schedule ROB appt.  Pt has not been seen in several weeks.

## 2014-10-26 ENCOUNTER — Telehealth: Payer: Self-pay | Admitting: *Deleted

## 2014-10-26 NOTE — Telephone Encounter (Signed)
Patient called wanting to set up her C- section 11:40 LM on VM- Can not schedule surgery for patient if she does not come into the office for appointment. Please call for an appointment.

## 2014-10-28 ENCOUNTER — Encounter: Payer: Self-pay | Admitting: Obstetrics

## 2014-10-28 ENCOUNTER — Ambulatory Visit (INDEPENDENT_AMBULATORY_CARE_PROVIDER_SITE_OTHER): Payer: Medicaid Other | Admitting: Obstetrics

## 2014-10-28 VITALS — BP 133/79 | HR 90 | Temp 98.1°F | Wt 230.0 lb

## 2014-10-28 DIAGNOSIS — O0993 Supervision of high risk pregnancy, unspecified, third trimester: Secondary | ICD-10-CM | POA: Diagnosis not present

## 2014-10-28 DIAGNOSIS — Z3483 Encounter for supervision of other normal pregnancy, third trimester: Secondary | ICD-10-CM

## 2014-10-28 LAB — POCT URINALYSIS DIPSTICK
Bilirubin, UA: NEGATIVE
Blood, UA: NEGATIVE
Glucose, UA: NEGATIVE
Ketones, UA: NEGATIVE
Nitrite, UA: NEGATIVE
PH UA: 6
Spec Grav, UA: 1.015
Urobilinogen, UA: NEGATIVE

## 2014-10-28 NOTE — Progress Notes (Signed)
Subjective:    Julie Mcconnell is a 29 y.o. female being seen today for her obstetrical visit. She is at 38w3dgestation. Patient reports no complaints. Fetal movement: normal.  Problem List Items Addressed This Visit    None     Patient Active Problem List   Diagnosis Date Noted  . Crohn's ileocolitis 07/22/2013  . Condyloma acuminatum in female 07/22/2013  . Maternal anemia complicating pregnancy, childbirth, or the puerperium 02/02/2011  . Normal delivery 02/01/2011  . Active labor 01/31/2011  . History of VBAC 01/31/2011    Objective:    BP 133/79 mmHg  Pulse 90  Temp(Src) 98.1 F (36.7 C)  Wt 230 lb (104.327 kg)  LMP 02/01/2014 FHT: 150 BPM  Uterine Size: size equals dates  Presentations: cephalic  Pelvic Exam: Deferred    Assessment:    Pregnancy @ 365w3deeks   Plan:   Plans for delivery: C/Section scheduled; labs reviewed; problem list updated Counseling: Consent signed. Infant feeding: plans to breastfeed. Cigarette smoking: smokes 1.5 PPD. L&D discussion: symptoms of labor, discussed when to call, discussed what number to call, anesthetic/analgesic options reviewed and delivering clinician:  plans Physician. Postpartum supports and preparation: circumcision discussed and contraception plans discussed.  Follow up in 1 Week.

## 2014-10-29 ENCOUNTER — Other Ambulatory Visit: Payer: Self-pay | Admitting: *Deleted

## 2014-10-30 NOTE — Patient Instructions (Addendum)
   Your procedure is scheduled on:  Thursday, June 23  Enter through the [employer] [facility] at: 6 AM Pick up the phone at the desk and dial [phone] and inform us of your arrival.  Please call this number if you have any problems the morning of surgery: [phone]  Remember: Do not eat or drink after midnight: Wednesday Take these medicines the morning of surgery with a SIP OF WATER:  NONE  Do not wear jewelry, make-up, or FINGER nail polish No metal in your hair or on your body. Do not wear lotions, powders, perfumes.  You may wear deodorant.  Do not bring valuables to the hospital.  Leave suitcase in the car. After Surgery it may be brought to your room. For patients being admitted to the hospital, checkout time is 11:00am the day of discharge.  Home with to be arranged prior to surgery.

## 2014-11-03 ENCOUNTER — Encounter: Payer: Medicaid Other | Admitting: Certified Nurse Midwife

## 2014-11-03 ENCOUNTER — Encounter (HOSPITAL_COMMUNITY)
Admission: RE | Admit: 2014-11-03 | Discharge: 2014-11-03 | Disposition: A | Discharge status: Home / Self Care | Payer: Medicaid Other | Source: Ambulatory Visit | Attending: Obstetrics | Admitting: Obstetrics

## 2014-11-03 ENCOUNTER — Ambulatory Visit (INDEPENDENT_AMBULATORY_CARE_PROVIDER_SITE_OTHER): Payer: Medicaid Other | Admitting: Certified Nurse Midwife

## 2014-11-03 ENCOUNTER — Encounter (HOSPITAL_COMMUNITY): Payer: Self-pay

## 2014-11-03 VITALS — BP 127/87 | HR 94 | Temp 98.1°F | Wt 227.3 lb

## 2014-11-03 DIAGNOSIS — Z01818 Encounter for other preprocedural examination: Secondary | ICD-10-CM | POA: Insufficient documentation

## 2014-11-03 DIAGNOSIS — O0993 Supervision of high risk pregnancy, unspecified, third trimester: Secondary | ICD-10-CM | POA: Diagnosis not present

## 2014-11-03 DIAGNOSIS — Z3483 Encounter for supervision of other normal pregnancy, third trimester: Secondary | ICD-10-CM

## 2014-11-03 HISTORY — DX: Anxiety disorder, unspecified: F41.9

## 2014-11-03 HISTORY — DX: Depression, unspecified: F32.A

## 2014-11-03 HISTORY — DX: Major depressive disorder, single episode, unspecified: F32.9

## 2014-11-03 LAB — POCT URINALYSIS DIPSTICK
Bilirubin, UA: NEGATIVE
Blood, UA: NEGATIVE
Glucose, UA: NEGATIVE
Nitrite, UA: NEGATIVE
Spec Grav, UA: 1.015
Urobilinogen, UA: 4
pH, UA: 6

## 2014-11-03 LAB — CBC
HCT: 29.4 % — ABNORMAL LOW (ref 36.0–46.0)
Hemoglobin: 10 g/dL — ABNORMAL LOW (ref 12.0–15.0)
MCH: 31.7 pg (ref 26.0–34.0)
MCHC: 34 g/dL (ref 30.0–36.0)
MCV: 93.3 fL (ref 78.0–100.0)
PLATELETS: 229 10*3/uL (ref 150–400)
RBC: 3.15 MIL/uL — AB (ref 3.87–5.11)
RDW: 14.5 % (ref 11.5–15.5)
WBC: 7.3 10*3/uL (ref 4.0–10.5)

## 2014-11-03 LAB — ABO/RH: ABO/RH(D): AB POS

## 2014-11-03 NOTE — Progress Notes (Signed)
Subjective:    Julie Mcconnell is a 29 y.o. female being seen today for her obstetrical visit. She is at 81w2dgestation. Patient reports no bleeding, no cramping, no leaking and occasional contractions. Fetal movement: normal.  Problem List Items Addressed This Visit    None     Patient Active Problem List   Diagnosis Date Noted  . Crohn's ileocolitis 07/22/2013  . Condyloma acuminatum in female 07/22/2013  . Maternal anemia complicating pregnancy, childbirth, or the puerperium 02/02/2011  . Normal delivery 02/01/2011  . Active labor 01/31/2011  . History of VBAC 01/31/2011    Objective:    BP 127/87 mmHg  Pulse 94  Temp(Src) 98.1 F (36.7 C)  Wt 227 lb 4.8 oz (103.103 kg)  LMP 02/01/2014 FHT: 143 BPM  Uterine Size: size equals dates  Presentations: cephalic  Pelvic Exam: deferred   NST: Reactive, Cat 1 tracing, 140's. + accels, no decels, no contractions.    Assessment:    Pregnancy @ 351w2deeks   C-Section is scheduled for Thursday & pre-op has been done today.  Reactive NST in office today.  Limited Prenatal Care/Non-compliance  Plan:   Plans for delivery: C/Section scheduled; labs reviewed; problem list updated Counseling: Consent signed. Infant feeding: plans to bottle feed. Cigarette smoking: smokes 1.5 PPD. L&D discussion: symptoms of labor, discussed when to call, discussed what number to call, anesthetic/analgesic options reviewed and delivering clinician:  plans Physician. Postpartum supports and preparation: circumcision discussed and contraception plans discussed.  Follow up in 2 days with C-Section and BTL.

## 2014-11-04 LAB — RPR: RPR: NONREACTIVE

## 2014-11-05 ENCOUNTER — Encounter (HOSPITAL_COMMUNITY)
Admission: RE | Disposition: A | Discharge status: Home / Self Care | Payer: Self-pay | Source: Ambulatory Visit | Attending: Obstetrics

## 2014-11-05 ENCOUNTER — Encounter (HOSPITAL_COMMUNITY): Payer: Self-pay | Admitting: *Deleted

## 2014-11-05 ENCOUNTER — Inpatient Hospital Stay (HOSPITAL_COMMUNITY): Payer: Medicaid Other | Admitting: Anesthesiology

## 2014-11-05 ENCOUNTER — Inpatient Hospital Stay (HOSPITAL_COMMUNITY)
Admission: RE | Admit: 2014-11-05 | Discharge: 2014-11-08 | DRG: 765 | Disposition: A | Discharge status: Home / Self Care | Payer: Medicaid Other | Source: Ambulatory Visit | Attending: Obstetrics | Admitting: Obstetrics

## 2014-11-05 DIAGNOSIS — O99344 Other mental disorders complicating childbirth: Secondary | ICD-10-CM | POA: Diagnosis present

## 2014-11-05 DIAGNOSIS — Z3A39 39 weeks gestation of pregnancy: Secondary | ICD-10-CM | POA: Diagnosis present

## 2014-11-05 DIAGNOSIS — F17210 Nicotine dependence, cigarettes, uncomplicated: Secondary | ICD-10-CM | POA: Diagnosis present

## 2014-11-05 DIAGNOSIS — Z302 Encounter for sterilization: Secondary | ICD-10-CM

## 2014-11-05 DIAGNOSIS — O9953 Diseases of the respiratory system complicating the puerperium: Secondary | ICD-10-CM | POA: Diagnosis not present

## 2014-11-05 DIAGNOSIS — O99334 Smoking (tobacco) complicating childbirth: Secondary | ICD-10-CM | POA: Diagnosis present

## 2014-11-05 DIAGNOSIS — O9962 Diseases of the digestive system complicating childbirth: Secondary | ICD-10-CM | POA: Diagnosis present

## 2014-11-05 DIAGNOSIS — F319 Bipolar disorder, unspecified: Secondary | ICD-10-CM | POA: Diagnosis present

## 2014-11-05 DIAGNOSIS — Z98891 History of uterine scar from previous surgery: Secondary | ICD-10-CM

## 2014-11-05 DIAGNOSIS — J069 Acute upper respiratory infection, unspecified: Secondary | ICD-10-CM | POA: Diagnosis not present

## 2014-11-05 DIAGNOSIS — K5090 Crohn's disease, unspecified, without complications: Secondary | ICD-10-CM | POA: Diagnosis present

## 2014-11-05 DIAGNOSIS — K219 Gastro-esophageal reflux disease without esophagitis: Secondary | ICD-10-CM | POA: Diagnosis present

## 2014-11-05 DIAGNOSIS — O3421 Maternal care for scar from previous cesarean delivery: Secondary | ICD-10-CM | POA: Diagnosis present

## 2014-11-05 DIAGNOSIS — F419 Anxiety disorder, unspecified: Secondary | ICD-10-CM | POA: Diagnosis present

## 2014-11-05 DIAGNOSIS — Z23 Encounter for immunization: Secondary | ICD-10-CM | POA: Diagnosis not present

## 2014-11-05 DIAGNOSIS — N858 Other specified noninflammatory disorders of uterus: Secondary | ICD-10-CM | POA: Diagnosis present

## 2014-11-05 HISTORY — PX: TUBAL LIGATION: SHX77

## 2014-11-05 LAB — PREPARE RBC (CROSSMATCH)

## 2014-11-05 SURGERY — Surgical Case
Anesthesia: Spinal | Site: Abdomen | Laterality: Bilateral

## 2014-11-05 MED ORDER — BUPIVACAINE IN DEXTROSE 0.75-8.25 % IT SOLN
INTRATHECAL | Status: DC | PRN
  Administered 2014-11-05: 1.6 mL via INTRATHECAL

## 2014-11-05 MED ORDER — OXYTOCIN 10 UNIT/ML IJ SOLN
INTRAMUSCULAR | Status: AC
  Filled 2014-11-05: qty 4

## 2014-11-05 MED ORDER — MORPHINE SULFATE 0.5 MG/ML IJ SOLN
INTRAMUSCULAR | Status: AC
  Filled 2014-11-05: qty 10

## 2014-11-05 MED ORDER — BUPIVACAINE HCL (PF) 0.25 % IJ SOLN
INTRAMUSCULAR | Status: AC
  Filled 2014-11-05: qty 30

## 2014-11-05 MED ORDER — ONDANSETRON HCL 4 MG/2ML IJ SOLN
INTRAMUSCULAR | Status: AC
  Filled 2014-11-05: qty 2

## 2014-11-05 MED ORDER — METHYLERGONOVINE MALEATE 0.2 MG/ML IJ SOLN: 0.2000 mg | Freq: Once | INTRAMUSCULAR | Status: DC

## 2014-11-05 MED ORDER — ZOLPIDEM TARTRATE 5 MG PO TABS: 5.0000 mg | ORAL_TABLET | Freq: Every evening | ORAL | Status: DC | PRN

## 2014-11-05 MED ORDER — SENNOSIDES-DOCUSATE SODIUM 8.6-50 MG PO TABS
2.0000 | ORAL_TABLET | ORAL | Status: DC
  Administered 2014-11-05: 2 via ORAL
  Filled 2014-11-05 (×2): qty 2

## 2014-11-05 MED ORDER — MEPERIDINE HCL 25 MG/ML IJ SOLN: 6.2500 mg | INTRAMUSCULAR | Status: DC | PRN

## 2014-11-05 MED ORDER — FENTANYL CITRATE (PF) 100 MCG/2ML IJ SOLN
INTRAMUSCULAR | Status: AC
  Filled 2014-11-05: qty 2

## 2014-11-05 MED ORDER — METHYLERGONOVINE MALEATE 0.2 MG/ML IJ SOLN
INTRAMUSCULAR | Status: AC
  Administered 2014-11-05: 0.2 mg via INTRAMUSCULAR
  Filled 2014-11-05: qty 1

## 2014-11-05 MED ORDER — NALBUPHINE HCL 10 MG/ML IJ SOLN: 5.0000 mg | INTRAMUSCULAR | Status: DC | PRN

## 2014-11-05 MED ORDER — SIMETHICONE 80 MG PO CHEW: 80.0000 mg | CHEWABLE_TABLET | ORAL | Status: DC | PRN

## 2014-11-05 MED ORDER — METHYLERGONOVINE MALEATE 0.2 MG PO TABS
ORAL_TABLET | ORAL | Status: AC
  Filled 2014-11-05: qty 1

## 2014-11-05 MED ORDER — CEFAZOLIN SODIUM-DEXTROSE 2-3 GM-% IV SOLR: 2.0000 g | Freq: Once | INTRAVENOUS | Status: DC

## 2014-11-05 MED ORDER — KETOROLAC TROMETHAMINE 30 MG/ML IJ SOLN
30.0000 mg | Freq: Four times a day (QID) | INTRAMUSCULAR | Status: AC | PRN
Start: 2014-11-05 — End: 2014-11-06

## 2014-11-05 MED ORDER — LANOLIN HYDROUS EX OINT: 1.0000 "application " | TOPICAL_OINTMENT | CUTANEOUS | Status: DC | PRN

## 2014-11-05 MED ORDER — IBUPROFEN 600 MG PO TABS
600.0000 mg | ORAL_TABLET | Freq: Four times a day (QID) | ORAL | Status: DC
  Administered 2014-11-05 – 2014-11-08 (×10): 600 mg via ORAL
  Filled 2014-11-05 (×11): qty 1

## 2014-11-05 MED ORDER — SIMETHICONE 80 MG PO CHEW
80.0000 mg | CHEWABLE_TABLET | ORAL | Status: DC
  Administered 2014-11-05 – 2014-11-07 (×3): 80 mg via ORAL
  Filled 2014-11-05 (×3): qty 1

## 2014-11-05 MED ORDER — DIPHENHYDRAMINE HCL 25 MG PO CAPS
25.0000 mg | ORAL_CAPSULE | Freq: Four times a day (QID) | ORAL | Status: DC | PRN
  Filled 2014-11-05: qty 1

## 2014-11-05 MED ORDER — LACTATED RINGERS IV SOLN: INTRAVENOUS | Status: DC

## 2014-11-05 MED ORDER — WITCH HAZEL-GLYCERIN EX PADS: 1.0000 "application " | MEDICATED_PAD | CUTANEOUS | Status: DC | PRN

## 2014-11-05 MED ORDER — NALOXONE HCL 0.4 MG/ML IJ SOLN: 0.4000 mg | INTRAMUSCULAR | Status: DC | PRN

## 2014-11-05 MED ORDER — TRIAMCINOLONE ACETONIDE 40 MG/ML IJ SUSP: 11.0000 mL | Freq: Once | INTRAMUSCULAR | Status: DC

## 2014-11-05 MED ORDER — OXYTOCIN 40 UNITS IN LACTATED RINGERS INFUSION - SIMPLE MED: 62.5000 mL/h | INTRAVENOUS | Status: AC

## 2014-11-05 MED ORDER — KETOROLAC TROMETHAMINE 30 MG/ML IJ SOLN: 30.0000 mg | Freq: Four times a day (QID) | INTRAMUSCULAR | Status: AC | PRN

## 2014-11-05 MED ORDER — SODIUM CHLORIDE 0.9 % IJ SOLN: 3.0000 mL | INTRAMUSCULAR | Status: DC | PRN

## 2014-11-05 MED ORDER — FENTANYL CITRATE (PF) 100 MCG/2ML IJ SOLN
INTRAMUSCULAR | Status: DC | PRN
  Administered 2014-11-05: 10 ug via INTRATHECAL

## 2014-11-05 MED ORDER — TETANUS-DIPHTH-ACELL PERTUSSIS 5-2.5-18.5 LF-MCG/0.5 IM SUSP
0.5000 mL | Freq: Once | INTRAMUSCULAR | Status: AC
  Administered 2014-11-08: 0.5 mL via INTRAMUSCULAR

## 2014-11-05 MED ORDER — LACTATED RINGERS IV SOLN
INTRAVENOUS | Status: DC | PRN
  Administered 2014-11-05: 08:00:00 via INTRAVENOUS

## 2014-11-05 MED ORDER — OXYCODONE-ACETAMINOPHEN 5-325 MG PO TABS
1.0000 | ORAL_TABLET | ORAL | Status: DC | PRN
  Administered 2014-11-06 – 2014-11-07 (×2): 1 via ORAL
  Filled 2014-11-05 (×2): qty 1

## 2014-11-05 MED ORDER — LACTATED RINGERS IV SOLN
INTRAVENOUS | Status: DC
  Administered 2014-11-05: 1 mL via INTRAVENOUS
  Administered 2014-11-06: 01:00:00 via INTRAVENOUS

## 2014-11-05 MED ORDER — MENTHOL 3 MG MT LOZG: 1.0000 | LOZENGE | OROMUCOSAL | Status: DC | PRN

## 2014-11-05 MED ORDER — 0.9 % SODIUM CHLORIDE (POUR BTL) OPTIME
TOPICAL | Status: DC | PRN
  Administered 2014-11-05: 1000 mL

## 2014-11-05 MED ORDER — SCOPOLAMINE 1 MG/3DAYS TD PT72
1.0000 | MEDICATED_PATCH | Freq: Once | TRANSDERMAL | Status: DC
  Administered 2014-11-05: 1.5 mg via TRANSDERMAL

## 2014-11-05 MED ORDER — DEXTROSE 5 % IV SOLN
1.0000 ug/kg/h | INTRAVENOUS | Status: DC | PRN
  Filled 2014-11-05: qty 2

## 2014-11-05 MED ORDER — ACETAMINOPHEN 325 MG PO TABS
650.0000 mg | ORAL_TABLET | ORAL | Status: DC | PRN
  Administered 2014-11-05 – 2014-11-06 (×3): 650 mg via ORAL
  Filled 2014-11-05 (×3): qty 2

## 2014-11-05 MED ORDER — NALBUPHINE HCL 10 MG/ML IJ SOLN: 5.0000 mg | Freq: Once | INTRAMUSCULAR | Status: AC | PRN

## 2014-11-05 MED ORDER — SIMETHICONE 80 MG PO CHEW
80.0000 mg | CHEWABLE_TABLET | Freq: Three times a day (TID) | ORAL | Status: DC
  Administered 2014-11-05 – 2014-11-08 (×8): 80 mg via ORAL
  Filled 2014-11-05 (×8): qty 1

## 2014-11-05 MED ORDER — SCOPOLAMINE 1 MG/3DAYS TD PT72
MEDICATED_PATCH | TRANSDERMAL | Status: AC
Start: 2014-11-05 — End: 2014-11-08
  Administered 2014-11-05: 1.5 mg via TRANSDERMAL
  Filled 2014-11-05: qty 1

## 2014-11-05 MED ORDER — DIPHENHYDRAMINE HCL 25 MG PO CAPS
25.0000 mg | ORAL_CAPSULE | ORAL | Status: DC | PRN
  Administered 2014-11-06: 25 mg via ORAL

## 2014-11-05 MED ORDER — PRENATAL MULTIVITAMIN CH
1.0000 | ORAL_TABLET | Freq: Every day | ORAL | Status: DC
  Administered 2014-11-06 – 2014-11-08 (×3): 1 via ORAL
  Filled 2014-11-05 (×3): qty 1

## 2014-11-05 MED ORDER — METHYLERGONOVINE MALEATE 0.2 MG PO TABS
0.2000 mg | ORAL_TABLET | Freq: Four times a day (QID) | ORAL | Status: DC
  Administered 2014-11-05 – 2014-11-07 (×9): 0.2 mg via ORAL
  Filled 2014-11-05 (×8): qty 1

## 2014-11-05 MED ORDER — OXYCODONE-ACETAMINOPHEN 5-325 MG PO TABS
2.0000 | ORAL_TABLET | ORAL | Status: DC | PRN
  Administered 2014-11-07: 2 via ORAL
  Filled 2014-11-05: qty 2

## 2014-11-05 MED ORDER — LACTATED RINGERS IV SOLN
Freq: Once | INTRAVENOUS | Status: AC
  Administered 2014-11-05: 06:00:00 via INTRAVENOUS

## 2014-11-05 MED ORDER — TRIAMCINOLONE ACETONIDE 40 MG/ML IJ SUSP
40.0000 mg | Freq: Once | INTRAMUSCULAR | Status: DC
  Filled 2014-11-05: qty 1

## 2014-11-05 MED ORDER — CEFAZOLIN SODIUM-DEXTROSE 2-3 GM-% IV SOLR
INTRAVENOUS | Status: AC
  Filled 2014-11-05: qty 50

## 2014-11-05 MED ORDER — ONDANSETRON HCL 4 MG/2ML IJ SOLN
4.0000 mg | Freq: Three times a day (TID) | INTRAMUSCULAR | Status: DC | PRN
Start: 2014-11-05 — End: 2014-11-08

## 2014-11-05 MED ORDER — DIPHENHYDRAMINE HCL 50 MG/ML IJ SOLN: 12.5000 mg | INTRAMUSCULAR | Status: DC | PRN

## 2014-11-05 MED ORDER — IBUPROFEN 600 MG PO TABS
600.0000 mg | ORAL_TABLET | Freq: Four times a day (QID) | ORAL | Status: DC | PRN
  Administered 2014-11-06: 600 mg via ORAL

## 2014-11-05 MED ORDER — MORPHINE SULFATE (PF) 0.5 MG/ML IJ SOLN
INTRAMUSCULAR | Status: DC | PRN
  Administered 2014-11-05: .2 mg via INTRATHECAL

## 2014-11-05 MED ORDER — DIBUCAINE 1 % RE OINT: 1.0000 "application " | TOPICAL_OINTMENT | RECTAL | Status: DC | PRN

## 2014-11-05 MED ORDER — FENTANYL CITRATE (PF) 100 MCG/2ML IJ SOLN
25.0000 ug | INTRAMUSCULAR | Status: DC | PRN
  Administered 2014-11-05 (×2): 50 ug via INTRAVENOUS

## 2014-11-05 MED ORDER — PHENYLEPHRINE 8 MG IN D5W 100 ML (0.08MG/ML) PREMIX OPTIME
INJECTION | INTRAVENOUS | Status: AC
  Filled 2014-11-05: qty 100

## 2014-11-05 MED ORDER — OXYTOCIN 10 UNIT/ML IJ SOLN
40.0000 [IU] | INTRAMUSCULAR | Status: DC | PRN
Start: 2014-11-05 — End: 2014-11-05
  Administered 2014-11-05: 40 [IU] via INTRAVENOUS

## 2014-11-05 MED ORDER — ONDANSETRON HCL 4 MG/2ML IJ SOLN
INTRAMUSCULAR | Status: DC | PRN
  Administered 2014-11-05: 4 mg via INTRAVENOUS

## 2014-11-05 SURGICAL SUPPLY — 36 items
CLAMP CORD UMBIL (MISCELLANEOUS) IMPLANT
CLOTH BEACON ORANGE TIMEOUT ST (SAFETY) ×3 IMPLANT
CONTAINER PREFILL 10% NBF 15ML (MISCELLANEOUS) ×6 IMPLANT
DRAPE SHEET LG 3/4 BI-LAMINATE (DRAPES) ×2 IMPLANT
DRSG OPSITE POSTOP 4X10 (GAUZE/BANDAGES/DRESSINGS) ×3 IMPLANT
DURAPREP 26ML APPLICATOR (WOUND CARE) ×3 IMPLANT
ELECT REM PT RETURN 9FT ADLT (ELECTROSURGICAL) ×3
ELECTRODE REM PT RTRN 9FT ADLT (ELECTROSURGICAL) ×1 IMPLANT
EXTRACTOR VACUUM M CUP 4 TUBE (SUCTIONS) IMPLANT
EXTRACTOR VACUUM M CUP 4' TUBE (SUCTIONS)
GLOVE BIO SURGEON STRL SZ8 (GLOVE) ×3 IMPLANT
GOWN STRL REUS W/TWL LRG LVL3 (GOWN DISPOSABLE) ×6 IMPLANT
KIT ABG SYR 3ML LUER SLIP (SYRINGE) IMPLANT
LIQUID BAND (GAUZE/BANDAGES/DRESSINGS) ×3 IMPLANT
NDL HYPO 25X5/8 SAFETYGLIDE (NEEDLE) ×1 IMPLANT
NEEDLE HYPO 22GX1.5 SAFETY (NEEDLE) ×3 IMPLANT
NEEDLE HYPO 25X5/8 SAFETYGLIDE (NEEDLE) ×3 IMPLANT
NS IRRIG 1000ML POUR BTL (IV SOLUTION) ×3 IMPLANT
PACK C SECTION WH (CUSTOM PROCEDURE TRAY) ×3 IMPLANT
PAD OB MATERNITY 4.3X12.25 (PERSONAL CARE ITEMS) ×3 IMPLANT
RTRCTR C-SECT PINK 25CM LRG (MISCELLANEOUS) ×3 IMPLANT
STAPLER VISISTAT 35W (STAPLE) IMPLANT
SUT GUT PLAIN 0 CT-3 TAN 27 (SUTURE) ×3 IMPLANT
SUT MNCRL 0 VIOLET CTX 36 (SUTURE) ×3 IMPLANT
SUT MNCRL AB 4-0 PS2 18 (SUTURE) IMPLANT
SUT MON AB 2-0 CT1 27 (SUTURE) ×3 IMPLANT
SUT MON AB 3-0 SH 27 (SUTURE)
SUT MON AB 3-0 SH27 (SUTURE) IMPLANT
SUT MONOCRYL 0 CTX 36 (SUTURE) ×6
SUT PLAIN 2 0 XLH (SUTURE) IMPLANT
SUT VIC AB 0 CTX 36 (SUTURE) ×6
SUT VIC AB 0 CTX36XBRD ANBCTRL (SUTURE) ×2 IMPLANT
SUT VIC AB 4-0 KS 27 (SUTURE) ×2 IMPLANT
SYR CONTROL 10ML LL (SYRINGE) ×3 IMPLANT
TOWEL OR 17X24 6PK STRL BLUE (TOWEL DISPOSABLE) ×3 IMPLANT
TRAY FOLEY CATH SILVER 14FR (SET/KITS/TRAYS/PACK) ×3 IMPLANT

## 2014-11-05 NOTE — H&P (Signed)
Julie Mcconnell is a 29 y.o. female presenting for repeat C/S and BTL. Maternal Medical History:  Fetal activity: Perceived fetal activity is normal.   Last perceived fetal movement was within the past hour.    Prenatal complications: no prenatal complications Prenatal Complications - Diabetes: none.    OB History    Gravida Para Term Preterm AB TAB SAB Ectopic Multiple Living   5 5 5  0 0 0 0 0 0 4     Past Medical History  Diagnosis Date  . History of chronic bronchitis 2012    last episode 3-4 yrs ago   . Crohn's disease   . History of concussion     child--  no residual  . Bipolar disorder   . SVD (spontaneous vaginal delivery)     x 3  . Depression   . Anxiety   . GERD (gastroesophageal reflux disease)     occasional w/pregnancy, diet controlled, no meds   Past Surgical History  Procedure Laterality Date  . Cesarean section  01-26-2005  . Inner ear surgery Right as child  . Umbilical hernia repair  age 4  . Tonsillectomy  as child  . Laser ablation condolamata N/A 08/25/2013    Procedure: LASER ABLATION CONDOLAMATA/ANAL EXAM UNDER ANESTHESIA;  Surgeon: Leighton Ruff, MD;  Location: [facility];  Service: General;  Laterality: N/A;  . Wisdom tooth extraction    . Tubes in ear      as child   Family History: family history includes Heart disease in her maternal grandmother; Hypertension in her mother. Social History:  reports that she has been smoking Cigarettes.  She has a 10 pack-year smoking history. She has never used smokeless tobacco. She reports that she drinks about 0.6 oz of alcohol per week. She reports that she does not use illicit drugs.   Prenatal Transfer Tool  Maternal Diabetes: No Genetic Screening: Normal Maternal Ultrasounds/Referrals: Normal Fetal Ultrasounds or other Referrals:  None Maternal Substance Abuse:  No Significant Maternal Medications:  None Significant Maternal Lab Results:  None Other Comments:  None  Review of  Systems  All other systems reviewed and are negative.     Blood pressure 106/66, pulse 86, temperature 97.9 F (36.6 C), temperature source Oral, resp. rate 18, last menstrual period 02/01/2014, SpO2 94 %, unknown if currently breastfeeding. Maternal Exam:  Abdomen: Surgical scars: low transverse.   Fetal presentation: vertex     Physical Exam  Nursing note and vitals reviewed. Constitutional: She is oriented to person, place, and time. She appears well-developed and well-nourished.  HENT:  Head: Normocephalic and atraumatic.  Eyes: Conjunctivae are normal. Pupils are equal, round, and reactive to light.  Neck: Normal range of motion. Neck supple.  Cardiovascular: Normal rate and regular rhythm.   Respiratory: Effort normal and breath sounds normal.  GI: Soft. Bowel sounds are normal.  Musculoskeletal: Normal range of motion.  Neurological: She is alert and oriented to person, place, and time.  Skin: Skin is warm and dry.  Psychiatric: She has a normal mood and affect. Her behavior is normal. Judgment and thought content normal.    Prenatal labs: ABO, Rh: --/--/AB POS, AB POS (06/21 1500) Antibody: NEG (06/21 1500) Rubella: 3.21 (01/20 1543) RPR: Non Reactive (06/21 1500)  HBsAg: NEGATIVE (01/20 1543)  HIV: NONREACTIVE (01/20 1543)  GBS:     Assessment/Plan: 39 weeks.  Previous C/S.  Desires repeat C/S and BTL.   HARPER,CHARLES A 11/05/2014, 9:26 AM

## 2014-11-05 NOTE — Progress Notes (Signed)
At shift change, assessment done on patient with day RN.  Honeycomb dressing saturated.  With serosanguinous drainage.  Dressing last changed at 1531.  Pt recently up and walking around room with standby assist of RN.  Bleeding small on pad with no clots noted and fundus firm at the umbilicus.  No trickle of blood noted with fundal massage. Notified Dr. Jodi Mourning.  No new orders at this time.  Will continue to monitor.

## 2014-11-05 NOTE — Anesthesia Postprocedure Evaluation (Signed)
  Anesthesia Post-op Note  Patient: Julie Mcconnell  Procedure(s) Performed: Procedure(s): CESAREAN SECTION WITH BILATERAL TUBAL LIGATION (Bilateral)  Patient Location: Mother/Baby  Anesthesia Type:Spinal  Level of Consciousness: awake, alert , oriented and patient cooperative  Airway and Oxygen Therapy: Patient Spontanous Breathing  Post-op Pain: none  Post-op Assessment: Post-op Vital signs reviewed, Patient's Cardiovascular Status Stable, Respiratory Function Stable, Patent Airway, No headache, No backache and Patient able to bend at knees  Post-op Vital Signs: Reviewed and stable  Last Vitals:  Filed Vitals:   11/05/14 1531  BP: 129/82  Pulse: 74  Temp:   Resp: 20    Complications: No apparent anesthesia complications

## 2014-11-05 NOTE — Op Note (Signed)
Cesarean Section Procedure Note   Julie Mcconnell   11/05/2014  Indications: Scheduled Proceedure/Maternal Request   Pre-operative Diagnosis: Desires repeat C-section delivery with Bilateral tubal ligation 93267,[id number].   Post-operative Diagnosis: Same   Surgeon: Baltazar Najjar A  Assistants: Frederico Hamman                     Surgical Technician  Anesthesia: spinal  Procedure Details:  The patient was seen in the Holding Room. The risks, benefits, complications, treatment options, and expected outcomes were discussed with the patient. The patient concurred with the proposed plan, giving informed consent. The patient was identified as Julie Mcconnell and the procedure verified as C-Section Delivery. A Time Out was held and the above information confirmed.  After induction of anesthesia, the patient was draped and prepped in the usual sterile manner. A transverse incision was made and carried down through the subcutaneous tissue to the fascia. The fascial incision was made and extended transversely. The fascia was separated from the underlying rectus tissue superiorly and inferiorly. The peritoneum was identified and entered. The peritoneal incision was extended longitudinally. The utero-vesical peritoneal reflection was incised transversely and the bladder flap was bluntly freed from the lower uterine segment. A low transverse uterine incision was made. Delivered from cephalic presentation was a 3249 gram living newborn female infant(s). APGAR (1 MIN): 9   APGAR (5 MINS): 9   APGAR (10 MINS):    A cord ph was not sent. The umbilical cord was clamped and cut cord. A sample was obtained for evaluation. The placenta was removed Intact and appeared normal.  The uterine incision was closed with running locked sutures of 0 Monocryl. A second imbricating layer of the same suture was placed.  Hemostasis was observed.  Attention then turned above to tubal ligation.  The right fallopian tube was  grasped in the isthmic area with a Babcock clamp and suture ligated through the mesosalpinx with 2-0 plain catgut.  The 1.5 cm segment of tube above the knot was excised and submitted to Pathology.  The tubal stumps were hemostatic and were place back in their normal anatomic position.  The same procedure was performed on the opposite side without complications.  The paracolic gutters were irrigated. The parieto peritoneum was closed in a running fashion with 2-0 Vicryl.  The fascia was then reapproximated with running sutures of 0 Vicryl.  The skin was closed with suture.  Instrument, sponge, and needle counts were correct prior the abdominal closure and were correct at the conclusion of the case.    Findings:  Normal uterus, ovaries and tubes   Estimated Blood Loss:  864m  Total IV Fluids: 20041m  Urine Output: 50CC OF clear urine  Specimens: @ORSPECIMEN @   Complications: no complications  Disposition: PACU - hemodynamically stable.  Maternal Condition: stable   Baby condition / location:  Couplet care / Skin to Skin    Signed: Surgeon(s): ChShelly BombardMD BeFrederico HammanMD

## 2014-11-05 NOTE — Anesthesia Postprocedure Evaluation (Signed)
  Anesthesia Post-op Note  Patient: Julie Mcconnell  Procedure(s) Performed: Procedure(s): CESAREAN SECTION WITH BILATERAL TUBAL LIGATION (Bilateral)  Patient Location: PACU  Anesthesia Type:Spinal  Level of Consciousness: awake, alert  and oriented  Airway and Oxygen Therapy: Patient Spontanous Breathing  Post-op Pain: none  Post-op Assessment: Post-op Vital signs reviewed, Patient's Cardiovascular Status Stable, Respiratory Function Stable, Patent Airway, No signs of Nausea or vomiting, Pain level controlled, No headache, No backache, Spinal receding and Patient able to bend at knees LLE Motor Response: Non-purposeful movement LLE Sensation: Tingling RLE Motor Response: Non-purposeful movement RLE Sensation: Tingling L Sensory Level: L5-Outer lower leg, top of foot, great toe R Sensory Level: L5-Outer lower leg, top of foot, great toe  Post-op Vital Signs: Reviewed and stable  Last Vitals:  Filed Vitals:   11/05/14 0945  BP: 123/72  Pulse:   Temp:   Resp: 15    Complications: No apparent anesthesia complications

## 2014-11-05 NOTE — Progress Notes (Signed)
UR chart review completed.  

## 2014-11-05 NOTE — Addendum Note (Signed)
Addendum  created 11/05/14 1709 by Raenette Rover, CRNA   Modules edited: Notes Section   Notes Section:  File: [id number]

## 2014-11-05 NOTE — Transfer of Care (Signed)
Immediate Anesthesia Transfer of Care Note  Patient: Julie Mcconnell  Procedure(s) Performed: Procedure(s): CESAREAN SECTION WITH BILATERAL TUBAL LIGATION (Bilateral)  Patient Location: PACU  Anesthesia Type:Spinal  Level of Consciousness: awake, alert  and oriented  Airway & Oxygen Therapy: Patient Spontanous Breathing  Post-op Assessment: Report given to RN and Post -op Vital signs reviewed and stable  Post vital signs: Reviewed and stable  Last Vitals:  Filed Vitals:   11/05/14 0930  BP: 119/72  Pulse:   Temp:   Resp: 16    Complications: No apparent anesthesia complications

## 2014-11-05 NOTE — Anesthesia Procedure Notes (Signed)
Spinal Patient location during procedure: OR Start time: 11/05/2014 7:32 AM End time: 11/05/2014 7:35 AM Staffing Anesthesiologist: Lyn Hollingshead Performed by: anesthesiologist  Preanesthetic Checklist Completed: patient identified, surgical consent, pre-op evaluation, timeout performed, IV checked, risks and benefits discussed and monitors and equipment checked Spinal Block Patient position: sitting Prep: site prepped and draped and DuraPrep Patient monitoring: heart rate, cardiac monitor, continuous pulse ox and blood pressure Approach: midline Location: L3-4 Injection technique: single-shot Needle Needle type: Pencan  Needle gauge: 24 G Needle length: 9 cm Needle insertion depth: 7 cm Assessment Sensory level: T4

## 2014-11-05 NOTE — Anesthesia Preprocedure Evaluation (Signed)
Anesthesia Evaluation  Patient identified by MRN, date of birth, ID band Patient awake    Reviewed: Allergy & Precautions, NPO status , Patient's Chart, lab work & pertinent test results  History of Anesthesia Complications Negative for: history of anesthetic complications  Airway Mallampati: III  TM Distance: >3 FB Neck ROM: Full    Dental no notable dental hx. (+) Dental Advisory Given   Pulmonary Current Smoker,  breath sounds clear to auscultation  Pulmonary exam normal       Cardiovascular negative cardio ROS Normal cardiovascular examRhythm:Regular Rate:Normal     Neuro/Psych PSYCHIATRIC DISORDERS Anxiety Depression Bipolar Disorder negative neurological ROS     GI/Hepatic Neg liver ROS, GERD-  Medicated and Controlled,Crohns disease treated with remicade, last prednisone several months prior per patient   Endo/Other  obesity  Renal/GU negative Renal ROS  negative genitourinary   Musculoskeletal negative musculoskeletal ROS (+)   Abdominal   Peds negative pediatric ROS (+)  Hematology negative hematology ROS (+)   Anesthesia Other Findings   Reproductive/Obstetrics (+) Pregnancy                             Anesthesia Physical Anesthesia Plan  ASA: III  Anesthesia Plan: Spinal   Post-op Pain Management:    Induction:   Airway Management Planned:   Additional Equipment:   Intra-op Plan:   Post-operative Plan:   Informed Consent: I have reviewed the patients History and Physical, chart, labs and discussed the procedure including the risks, benefits and alternatives for the proposed anesthesia with the patient or authorized representative who has indicated his/her understanding and acceptance.   Dental advisory given  Plan Discussed with: CRNA  Anesthesia Plan Comments:         Anesthesia Quick Evaluation

## 2014-11-06 LAB — CBC
HEMATOCRIT: 23.9 % — AB (ref 36.0–46.0)
Hemoglobin: 8.1 g/dL — ABNORMAL LOW (ref 12.0–15.0)
MCH: 31.6 pg (ref 26.0–34.0)
MCHC: 33.9 g/dL (ref 30.0–36.0)
MCV: 93.4 fL (ref 78.0–100.0)
Platelets: 198 10*3/uL (ref 150–400)
RBC: 2.56 MIL/uL — ABNORMAL LOW (ref 3.87–5.11)
RDW: 14.3 % (ref 11.5–15.5)
WBC: 7.7 10*3/uL (ref 4.0–10.5)

## 2014-11-06 MED ORDER — CLARITHROMYCIN 500 MG PO TABS: 500.0000 mg | ORAL_TABLET | Freq: Two times a day (BID) | ORAL | Status: DC

## 2014-11-06 MED ORDER — CEFUROXIME AXETIL 500 MG PO TABS
500.0000 mg | ORAL_TABLET | Freq: Two times a day (BID) | ORAL | Status: DC
  Administered 2014-11-06 – 2014-11-08 (×5): 500 mg via ORAL
  Filled 2014-11-06 (×5): qty 1

## 2014-11-06 MED ORDER — FERROUS SULFATE 325 (65 FE) MG PO TABS
325.0000 mg | ORAL_TABLET | Freq: Three times a day (TID) | ORAL | Status: DC
  Administered 2014-11-06 – 2014-11-08 (×6): 325 mg via ORAL
  Filled 2014-11-06 (×6): qty 1

## 2014-11-06 NOTE — Progress Notes (Signed)
CLINICAL SOCIAL WORK MATERNAL/CHILD NOTE  Patient Details  Name: Julie Mcconnell MRN: [id number] Date of Birth: 11/05/2014  Date:  11/06/2014  Clinical Social Worker Initiating Note:  Lucita Ferrara, LCSW Date/ Time Initiated:  11/06/14/1100     Child's Name:  Julie Mcconnell   Legal Guardian:  Mother: Julie Mcconnell. FOB is not listed on the birth certificate   Need for Interpreter:  None   Date of Referral:  11/05/14     Reason for Referral: Current Substance Use/Substance Use During Pregnancy    Referral Source:  [facility]   Address:  [address] [place], [place] [address]  Phone number:  [phone]   Household Members:  Minor Children (ages 68,5,8, and 26)   Natural Supports (not living in the home):  Immediate Family (mother), Spouse/significant other IT sales professional)  Professional Supports: None   Employment: Unemployed   Type of Work:   N/A  Education:    N/A  Pensions consultant:  Medicaid   Other Resources:  Physicist, medical , [place] Considerations Which May Impact Care: None reported  Strengths:  Ability to meet basic needs , Home prepared for child    Risk Factors/Current Problems:   1) Substance Use: MOB presents with etoh use during the pregnancy. MOB reported was a vague historian, but reported 1 beer once every week "or so".  MOB denied additional substance use during the pregnancy. Infant's UDS is negative and MDS is pending. 2) Mental Health Concerns: MOB presents with history of bipolar, depression, and anxiety.  MOB denied need for current medication, but was previously prescribed Lamictal (per medical records).   Cognitive State:  Poor Insight, Guarded, Difficult to engage  Mood/Affect:  Flat, Tired, Limited range in affect  CSW Assessment:  CSW received consult for limited prenatal care and etoh use during the pregnancy.  MOB was resting in her room upon CSW arrival.  Per RN, MOB has been difficult to arouse all morning.  MOB  presented as difficult to engage and was a limited historian.  She frequently asked why CSW was asking numerous questions, and often was vague in her responses.  Her voice was soft and difficult to understand at times.  MOB maintained minimal eye contact, no interaction occurred between MOB and the infant during the visit.  MOB displayed a flat affect, with a limited range in affect noted.   MOB denied questions, concerns, or needs as she transitions to the postpartum period. She stated that she is "tired" and was unable to sleep the previous evening.  MOB stated that she lives at home with her children, ages 58,5,8, and 47. At first, MOB only identified 3 other children until CSW inquired about the 4th child. She stated that her mother lives in [place] and is supportive. MOB also stated that the FOB is supportive, and when asked his name, she stated, "you can call him Liliane Channel".  Per MOB, she is unemployed, but is able to afford all of her daily living expenses and meet basic needs.  MOB denied feeling stressed by financial obligations.  MOB shared that she has secured all items for the infant, and denied need for current baby supplies.   MOB acknowledged diagnoses of depression, anxiety, and bipolar in her chart.  When asked to clarify her diagnoses and history, she asked why CSW wanted to know the diagnoses and history.  She shared that she was previously prescribed medication and in therapy, but stated that the last time she was evaluated, she  was informed that symptoms were "mild".  MOB declined to clarify the previous location of where she received mental health treatment, but stated that she can call if she needs to mental health support in the future.  MOB declined barriers to accessing care since she knows that mental health treatment can be a "good thing" if needed.  MOB denied history of inpatient treatment.   MOB presented as resistant to discussing her substance use during the pregnancy. She  acknowledged etoh use during the pregnancy, but was vague when CSW attempted to clarify use.  MOB shared that she had one drink every "week or so".  Her comments highlighted that she does not identify her substance use as a current problem, and she stated that she has never been in substance abuse treatment.  MOB declined assistance for substance abuse at this time.  MOB verbalized understanding of infant's UDS and MDS, and expressed belief that they will be negative.   MOB denied additional questions ,concerns, or needs at this time. She continued to inquire why CSW asked "so many questions".  MOB agreed to contact CSW if needs arise during the admission.   CSW Plan/Description:   1) CSW contacted CPS to inquire about current/open case.  CPS denied current CPS case. CSW did not make new CPS report at this time.  2)Patient/Family Education: Increased risk for postpartum depression/anxiety due to mental health history 3) MOB declined mental health and substance abuse referral information stating that she knows who she can contact if there is a mental health need. 4)No Further Intervention Required/No Barriers to Discharge: Contact CSW if concerns or needs arise.  Sheilah Mins, LCSW 11/06/2014, 12:04 PM

## 2014-11-06 NOTE — Progress Notes (Signed)
Subjective: Postpartum Day #1: Cesarean Delivery Patient reports incisional pain and tolerating PO.    Objective: Vital signs in last 24 hours: Temp:  [97.9 F (36.6 C)-98.6 F (37 C)] 98.4 F (36.9 C) (06/24 0535) Pulse Rate:  [67-75] 68 (06/24 0535) Resp:  [15-22] 16 (06/24 0535) BP: (110-129)/(56-82) 122/59 mmHg (06/24 0535) SpO2:  [92 %-100 %] 100 % (06/24 0535) Weight:  [237 lb (107.502 kg)] 237 lb (107.502 kg) (06/23 1851)  Physical Exam:  General: alert, cooperative, no distress and moderately obese Lochia: appropriate Uterine Fundus: firm Incision: no significant drainage, dressing has been changed, current ABD pad is dry. DVT Evaluation: No evidence of DVT seen on physical exam. Calf/Ankle edema is present. Lungs CTA, does have non-productive cough.     Recent Labs  11/03/14 1500 11/06/14 0515  HGB 10.0* 8.1*  HCT 29.4* 23.9*    Assessment/Plan: Status post Cesarean section. Doing well postoperatively.  Start antibiotics for URI Continue current care.  Julie Mcconnell Julie Mcconnell 11/06/2014, 9:11 AM

## 2014-11-07 ENCOUNTER — Encounter (HOSPITAL_COMMUNITY): Payer: Self-pay | Admitting: Obstetrics

## 2014-11-07 LAB — TYPE AND SCREEN
ABO/RH(D): AB POS
ANTIBODY SCREEN: NEGATIVE
UNIT DIVISION: 0
Unit division: 0

## 2014-11-07 NOTE — Progress Notes (Signed)
Patient ID: Julie Mcconnell, female   DOB: 17-Mar-1986, 29 y.o.   MRN: [id number] Postpartum day 2 Blood pressure 102/58 respiration 18 pulse 49 Fundus firm lochia moderate patient has no complaints doing well

## 2014-11-08 NOTE — Progress Notes (Signed)
Patient ID: Julie Mcconnell, female   DOB: 08/25/1985, 29 y.o.   MRN: [id number] Postop day 3 Blood pressure 136/80 pulse 18 respiration 65 afebrile Patient feels fine Incision clean and dry Lochia moderate Discharge today

## 2014-11-08 NOTE — Discharge Summary (Signed)
Obstetric Discharge Summary Reason for Admission: onset of labor Prenatal Procedures: none Intrapartum Procedures: cesarean: low cervical, transverse Postpartum Procedures: none Complications-Operative and Postpartum: none HEMOGLOBIN  Date Value Ref Range Status  11/06/2014 8.1* 12.0 - 15.0 g/dL Final   HCT  Date Value Ref Range Status  11/06/2014 23.9* 36.0 - 46.0 % Final    Physical Exam:  General: alert Lochia: appropriate Uterine Fundus: firm Incision: healing well DVT Evaluation: No evidence of DVT seen on physical exam.  Discharge Diagnoses: Term Pregnancy-delivered  Discharge Information: Date: 11/08/2014 Activity: pelvic rest Diet: routine Medications: Percocet Condition: stable Instructions: refer to practice specific booklet Discharge to: home Follow-up Information    Follow up with HARPER,CHARLES A, MD.   Specialty:  Obstetrics and Gynecology   Contact information:   [address] [place] [address] [phone]       Newborn Data: Live born female  Birth Weight: 7 lb 2.6 oz (3249 g) APGAR: 9, 9  Home with mother.  Tremeka Helbling A 11/08/2014, 6:46 AM

## 2014-11-08 NOTE — Discharge Instructions (Signed)
Discharge instructions   You can wash your hair  Shower  Eat what you want  Drink what you want  See me in 6 weeks  Your ankles are going to swell more in the next 2 weeks than when pregnant  No sex for 6 weeks   Sherell Christoffel A, MD 11/08/2014

## 2014-11-13 ENCOUNTER — Ambulatory Visit: Payer: Medicaid Other | Admitting: Obstetrics

## 2014-11-17 ENCOUNTER — Ambulatory Visit: Payer: Medicaid Other | Admitting: Obstetrics

## 2015-01-26 ENCOUNTER — Telehealth: Payer: Self-pay

## 2015-01-26 NOTE — Telephone Encounter (Signed)
Received call last week from [employer] asking if we have an alternate phone number. Only one number for pt. They have been unable to reach pt since 11/27/14. They have been unable to get the pt her [facility] due to her not returning their call. This RN has placed several calls and left voicemails but she has not called back. Dr. Hilarie Fredrickson notified.

## 2015-01-26 NOTE — Telephone Encounter (Signed)
Please mail patient a letter stating she needs to contact me for an appt. No refills on any meds written by me until she is seen

## 2015-01-26 NOTE — Telephone Encounter (Signed)
Letter mailed to pt.  

## 2015-01-29 ENCOUNTER — Telehealth: Payer: Self-pay | Admitting: Internal Medicine

## 2015-01-29 NOTE — Telephone Encounter (Signed)
Pt was mailed letter earlier this week stating she needs to schedule an appt for any meds will be refilled. Pt wanted to  Know what she was supposed to do if she needed something because 1st available is into November. Explained to pt that she had not returned calls to home health RN or [facility] since July and that is why she received the letter. Pt states she did leave messages and did not get a call back. Called [facility] and spoke to [place] the RN there and she placed multiple calls to pt and never got a call back. States this is not the first time but the longest amount of time without contact from [facility]. Last Injection was July 15th. Should pt be scheduled for 1st available appt and if she has issues she needs to call and they will be addressed at that time? Please advise.

## 2015-02-03 NOTE — Telephone Encounter (Signed)
She has continued to show noncompliance and stopped her biologic therapy for her Crohn's disease without medical input from me Previously I discussed with her that this medication should be uninterrupted if at all possible Home health was going to her home for injection but she made herself unavailable Please have her follow-up next available and urgently as needed

## 2015-02-04 ENCOUNTER — Telehealth: Payer: Self-pay | Admitting: Internal Medicine

## 2015-02-04 ENCOUNTER — Encounter: Payer: Self-pay | Admitting: *Deleted

## 2015-02-04 NOTE — Telephone Encounter (Signed)
Pt scheduled to see Dr. Hilarie Fredrickson 02/22/15@2 :45pm. Left message for pt to call back. Appt letter also mailed.

## 2015-02-04 NOTE — Telephone Encounter (Signed)
Noted  

## 2015-02-08 NOTE — Telephone Encounter (Signed)
Message left for pt regarding appt letter.

## 2015-02-15 ENCOUNTER — Telehealth: Payer: Self-pay | Admitting: Internal Medicine

## 2015-02-15 NOTE — Telephone Encounter (Signed)
I have attempted to reach [facility] at the number listed below and continue to get a message that the number I am calling does not accept calls from this location. I will await a return call.

## 2015-02-22 ENCOUNTER — Telehealth: Payer: Self-pay

## 2015-02-22 ENCOUNTER — Other Ambulatory Visit (INDEPENDENT_AMBULATORY_CARE_PROVIDER_SITE_OTHER): Payer: Medicaid Other

## 2015-02-22 ENCOUNTER — Encounter: Payer: Self-pay | Admitting: Internal Medicine

## 2015-02-22 ENCOUNTER — Ambulatory Visit (INDEPENDENT_AMBULATORY_CARE_PROVIDER_SITE_OTHER): Payer: Medicaid Other | Admitting: Internal Medicine

## 2015-02-22 VITALS — BP 122/68 | HR 76 | Wt 192.0 lb

## 2015-02-22 DIAGNOSIS — K50819 Crohn's disease of both small and large intestine with unspecified complications: Secondary | ICD-10-CM | POA: Diagnosis not present

## 2015-02-22 DIAGNOSIS — Z720 Tobacco use: Secondary | ICD-10-CM | POA: Diagnosis not present

## 2015-02-22 DIAGNOSIS — Z79899 Other long term (current) drug therapy: Secondary | ICD-10-CM

## 2015-02-22 LAB — CBC WITH DIFFERENTIAL/PLATELET
BASOS PCT: 0.9 % (ref 0.0–3.0)
Basophils Absolute: 0.1 10*3/uL (ref 0.0–0.1)
EOS ABS: 0.2 10*3/uL (ref 0.0–0.7)
EOS PCT: 4 % (ref 0.0–5.0)
HEMATOCRIT: 32.8 % — AB (ref 36.0–46.0)
HEMOGLOBIN: 10.7 g/dL — AB (ref 12.0–15.0)
LYMPHS PCT: 23.2 % (ref 12.0–46.0)
Lymphs Abs: 1.4 10*3/uL (ref 0.7–4.0)
MCHC: 32.8 g/dL (ref 30.0–36.0)
MCV: 77.3 fl — AB (ref 78.0–100.0)
Monocytes Absolute: 0.5 10*3/uL (ref 0.1–1.0)
Monocytes Relative: 7.5 % (ref 3.0–12.0)
NEUTROS ABS: 3.9 10*3/uL (ref 1.4–7.7)
Neutrophils Relative %: 64.4 % (ref 43.0–77.0)
PLATELETS: 487 10*3/uL — AB (ref 150.0–400.0)
RBC: 4.24 Mil/uL (ref 3.87–5.11)
RDW: 19.4 % — AB (ref 11.5–15.5)
WBC: 6 10*3/uL (ref 4.0–10.5)

## 2015-02-22 LAB — COMPREHENSIVE METABOLIC PANEL
ALBUMIN: 3.9 g/dL (ref 3.5–5.2)
ALT: 19 U/L (ref 0–35)
AST: 16 U/L (ref 0–37)
Alkaline Phosphatase: 62 U/L (ref 39–117)
BUN: 7 mg/dL (ref 6–23)
CHLORIDE: 105 meq/L (ref 96–112)
CO2: 29 meq/L (ref 19–32)
Calcium: 8.9 mg/dL (ref 8.4–10.5)
Creatinine, Ser: 0.58 mg/dL (ref 0.40–1.20)
GFR: 157.24 mL/min (ref >60.00)
Glucose, Bld: 83 mg/dL (ref 70–99)
POTASSIUM: 4.4 meq/L (ref 3.5–5.1)
Sodium: 139 mEq/L (ref 135–145)
Total Bilirubin: 0.5 mg/dL (ref 0.2–1.2)
Total Protein: 7.3 g/dL (ref 6.0–8.3)

## 2015-02-22 LAB — FOLATE: FOLATE: 5.3 ng/mL — AB (ref >5.9)

## 2015-02-22 LAB — IBC PANEL
Iron: 56 ug/dL (ref 42–145)
Saturation Ratios: 10.9 % — ABNORMAL LOW (ref 20.0–50.0)
TRANSFERRIN: 367 mg/dL — AB (ref 212.0–360.0)

## 2015-02-22 LAB — HIGH SENSITIVITY CRP: CRP, High Sensitivity: 3.68 mg/L (ref 0.000–5.000)

## 2015-02-22 LAB — FERRITIN: Ferritin: 4.1 ng/mL — ABNORMAL LOW (ref 10.0–291.0)

## 2015-02-22 NOTE — Patient Instructions (Signed)
Your physician has requested that you go to the basement for the following lab work before leaving today: CRP, TB Gold, CMP, CBC, IBC  Please follow up with Dr Hilarie Fredrickson in 6 months.  We will get Cimzia restarted for you. If you do not hear back from [name] within 1 week, please call our office at [phone].

## 2015-02-22 NOTE — Telephone Encounter (Signed)
Called [facility] to see if they will resume Cimzia for pt. Per Director they have put in a lot of man hours trying to contact the pt by phone and her not returning their calls. Also state the nurse would go out to visit the pt and the door would not be answered. Will speak with Elmyra Ricks the RN that was seeing the pt and let us know if they will take pt back.

## 2015-02-22 NOTE — Progress Notes (Signed)
   Subjective:    Patient ID: Julie Mcconnell, female    DOB: 1986-01-23, 29 y.o.   MRN: [id number]  HPI  Krithi Bray is a 29 year old female with ileocolonic Crohn's disease diagnosed in January 2015 who is here for follow-up. She was last seen in February 2016 and was lost to follow-up for short time. She was maintained on Cimzia and it had good clinical response. She was also pregnant and delivered a baby girl 5 months ago. She had bilateral tubal ligation thereafter. She was having home health give her Cimzia dosing every 4 weeks but home health stopped after failing to get in touch with her after multiple attempts. She states this was a miscommunication an issue with shipping and scheduling a medication. She reports while on the medicine her abdominal pain loose stools and rectal bleeding had resolved completely. In the last few weeks to a month she's had return of loose stools urgency and tenesmus but no abdominal pain and no rectal bleeding. She would like to get back on Cimzia. She is still smoking but has reduced to about 10 cigarettes per day. She has occasional nausea but no vomiting. Reports good appetite. Does have a chronic cough. She's had issue with uveitis and is seen ophthalmology. She is also dealing with a stye in her right eye. She denies rashes.  She is changed her phone number [phone]  Review of Systems as per history of present illness, otherwise negative  Current Medications, Allergies, Past Medical History, Past Surgical History, Family History and Social History were reviewed in [employer] record.      Objective:   Physical Exam BP 122/68 mmHg  Pulse 76  Wt 192 lb (87.091 kg)  LMP 02/18/2015 (Exact Date)  Breastfeeding? No Constitutional: Well-developed and well-nourished. No distress. Smells of cigarette smoke HEENT: Normocephalic and atraumatic. Oropharynx is clear and moist. No oropharyngeal exudate. Conjunctivae are normal.  No  scleral icterus. Neck: Neck supple. Trachea midline. Cardiovascular: Normal rate, regular rhythm and intact distal pulses. No M/R/G Pulmonary/chest: Effort normal and breath sounds normal. No wheezing, rales or rhonchi. Abdominal: Soft, nontender, nondistended. Bowel sounds active throughout.  Extremities: no clubbing, cyanosis, or edema Neurological: Alert and oriented to person place and time. Skin: Skin is warm and dry. No rashes noted. Psychiatric: Normal mood and affect. Behavior is normal.   labs ordered today      Assessment & Plan:  29 year old female with ileocolonic Crohn's disease diagnosed in January 2015 who is here for follow-up.  1. Ileocolonic Crohn's  -- previous excellent response to Cimzia therapy unfortunately therapy was interrupted due to scheduling and administration issues between the patient and home health. She reports she had to change her phone number. She would like to resume Cimzia and I think this is best for her. We'll resume Cimzia 400 mg every 4 weeks. I recommended that she stop smoking as tobacco use worsens Crohn's disease. She reports she is trying. Annual flu vaccine recommended.  Quantiferon gold test today.  2. Uveitis  -- had improved with Cimzia. Follow with ophthalmology   3. Tobacco abuse -- tobacco counseling offered and cessation strongly recommended particularly given IBD  Follow-up in 6 months, sooner if necessary  25 minutes spent together today

## 2015-02-24 ENCOUNTER — Other Ambulatory Visit: Payer: Self-pay

## 2015-02-24 DIAGNOSIS — K5090 Crohn's disease, unspecified, without complications: Secondary | ICD-10-CM

## 2015-02-24 MED ORDER — FOLIC ACID 1 MG PO TABS: 1.0000 mg | ORAL_TABLET | Freq: Every day | ORAL | Status: DC

## 2015-02-24 MED ORDER — INTEGRA 62.5-62.5-40-3 MG PO CAPS: 62.5000 mg | ORAL_CAPSULE | Freq: Every day | ORAL | Status: DC

## 2015-02-26 NOTE — Telephone Encounter (Signed)
Spoke with Nicole-RN with [facility]. They are willing to take pt on again but they do stress that she needs to be more compliant. Call placed to [facility] and they are faxing a new referral form to be sent in regarding the cimzia. Phone number for [employer] [phone]. Will call Elmyra Ricks and pt back when referral form received and taken care of.

## 2015-03-03 NOTE — Telephone Encounter (Signed)
Caller name: [facility]  Call back number:  [phone]  Ext [phone]   Reason for call:  Calling regarding medication

## 2015-03-03 NOTE — Telephone Encounter (Signed)
Referral form will be faxed back asap once signed by Dr. Hilarie Fredrickson

## 2015-03-10 NOTE — Telephone Encounter (Signed)
Spoke with Nicole-RN and she has an appt tomorrow to go and give the pt her Cimzia. She may have to make another visit if pt does not have med yet.

## 2015-03-18 ENCOUNTER — Telehealth: Payer: Self-pay | Admitting: Internal Medicine

## 2015-03-18 NOTE — Telephone Encounter (Signed)
Script for cimzia 429m [place] every 4 weeks with 12 refills was faxed to [facility] 114/10/30

## 2015-03-18 NOTE — Telephone Encounter (Signed)
Vaughan Basta- Did you already send Cimzia when you requested nurse visits or do I need to refill?

## 2015-03-19 NOTE — Telephone Encounter (Signed)
Left message advising patient of information below.

## 2015-03-22 ENCOUNTER — Telehealth: Payer: Self-pay

## 2015-03-22 NOTE — Telephone Encounter (Signed)
Patient called back and left voicemail that [facility] needed her refills. I attempted to call patient back to discuss as [facility] nurses were the ones we called to administer the injections (per [place]). I left a message as patient did not answer.

## 2015-03-22 NOTE — Telephone Encounter (Signed)
[facility] called for updated C[facility]zia orders. Called [facility] and old orders were sent to [facility] med pharmacy and they in turn sent the orders to [facility]. [facility] states they will fax the updated orders to [facility].

## 2015-04-01 ENCOUNTER — Telehealth: Payer: Self-pay | Admitting: Internal Medicine

## 2015-04-02 NOTE — Telephone Encounter (Signed)
Prior authorization form sent via [email or web address]

## 2015-04-06 ENCOUNTER — Telehealth: Payer: Self-pay | Admitting: Internal Medicine

## 2015-04-06 NOTE — Telephone Encounter (Signed)
I have contacted [employer] via telephone to do prior authorization for Cimzia. Conversation : [phone]. Per Myrlene Broker, they have 24 hours to approve/deny authorization request. They do not call/fax response, so we will have to call to find out outcome. PA # [id number].

## 2015-04-07 NOTE — Telephone Encounter (Signed)
Patient's Cimzia was been approved from 04/06/15-03/31/16. Reference number: [id number]. [facility] advised of this.

## 2015-05-25 ENCOUNTER — Telehealth: Payer: Self-pay

## 2015-05-25 NOTE — Telephone Encounter (Signed)
Pt aware.

## 2015-05-25 NOTE — Telephone Encounter (Signed)
-----   Message from Algernon Huxley, RN sent at 02/25/2015  2:43 PM EDT ----- Regarding: Labs Pt needs labs in 3 mth, orders in epic.

## 2015-07-30 NOTE — Telephone Encounter (Signed)
Called patient

## 2015-08-26 ENCOUNTER — Other Ambulatory Visit: Payer: Self-pay | Admitting: *Deleted

## 2015-08-26 MED ORDER — CERTOLIZUMAB PEGOL 2 X 200 MG/ML ~~LOC~~ KIT: PACK | SUBCUTANEOUS | Status: DC

## 2015-09-27 ENCOUNTER — Telehealth: Payer: Self-pay | Admitting: Internal Medicine

## 2015-09-27 ENCOUNTER — Telehealth: Payer: Self-pay

## 2015-09-27 NOTE — Telephone Encounter (Signed)
Received call from [place] with [facility]/cimzia. Pts last dose of cimzia was 07/26/15. Pt was moving and pt "lost the shipment" for April. Dose for May was sent to the old address and spoiled sitting outside. Pt has been seeing home health nurse with [facility] [phone]. Spoke with Colletta Maryland and this RN is going to request that cimzia be shipped to our office address. Colletta Maryland states that she will come to the office and pick the med up and go administer the drug to the pt. Waiting on [place] to call back to confirm change of shipping address.

## 2015-09-27 NOTE — Telephone Encounter (Signed)
Pt called and states that she has not had her cimzia since March. States her eyes are bothering her, she saw her eye doctor and has been placed on some steroid drops but thinks she also needs some prednisone to take PO. Please advise.

## 2015-09-27 NOTE — Telephone Encounter (Signed)
She needs to provide an explanation as to why she has missed doses of Cimzia for the last 2 months and not notified my office She is also due followup and needs to be seen. Likely needs to be worked in to see an APP

## 2015-09-29 NOTE — Telephone Encounter (Signed)
Patient calling in stating that she is out of cimzia. Patient states that her temporary address is [address] [place], [place] [address]. P: [phone]

## 2015-09-29 NOTE — Telephone Encounter (Signed)
Cimzia here, left message for nurse so she can come pick med up.

## 2015-09-29 NOTE — Telephone Encounter (Signed)
Spoke with Ch[place]a and let her know [facility] is now going to ship her Cimzia to our office and her home health nurse will pick up med from here to give pt. Spoke with Kendall with [facility] [phone]

## 2015-09-29 NOTE — Telephone Encounter (Signed)
Pt scheduled to see Dr. Hilarie Fredrickson 10/05/15@3 :30pm. Pt aware of appt and knows Dr. Hilarie Fredrickson will not prescribe prednisone, states it should be from the eye doctor that she saw.

## 2015-09-29 NOTE — Telephone Encounter (Signed)
Nurse to come by tomorrow and get Cimzia and give injection to pt.

## 2015-10-05 ENCOUNTER — Ambulatory Visit: Payer: Medicaid Other | Admitting: Internal Medicine

## 2015-10-06 ENCOUNTER — Telehealth: Payer: Self-pay | Admitting: *Deleted

## 2015-10-06 NOTE — Telephone Encounter (Signed)
No show letter mailed to patient.

## 2015-11-22 ENCOUNTER — Telehealth: Payer: Self-pay

## 2015-11-22 NOTE — Telephone Encounter (Signed)
-----   Message from Algernon Huxley, RN sent at 11/09/2015  9:42 AM EDT ----- Regarding: Cimzia Order Cimzia to be delivered to the office from [facility] 11/29/15.

## 2015-11-22 NOTE — Telephone Encounter (Signed)
Called [facility] to order pts Cimzia, left message for Altha Harm to call back.

## 2015-11-24 NOTE — Telephone Encounter (Signed)
Cimzia arrived in the office today. [facility] nurse-Stephanie called and notified. She should call when coming to pick up the cimzia.

## 2016-01-11 ENCOUNTER — Telehealth: Payer: Self-pay

## 2016-01-11 IMAGING — CT CT ABD-PELV W/ CM
2 of 4 series · 17 of 46 positions shown, 19 images · IV contrast (omnipaque)
Comparison: None.

CLINICAL DATA: RECTAL BLEEDING, left lower quadrant pain

EXAM:
CT ABDOMEN AND PELVIS WITH CONTRAST
TECHNIQUE: Multidetector CT imaging of the abdomen and pelvis was performed
using the standard protocol following bolus administration of
intravenous contrast.
CONTRAST:  115mL OMNIPAQUE IOHEXOL 300 MG/ML  SOLN

[Series 2: routine · axial · 0.87mm/px · z∈[+96,+556]mm · 14 of 102 slices shown, 16 images]
[im 5/102  soft-tissue]
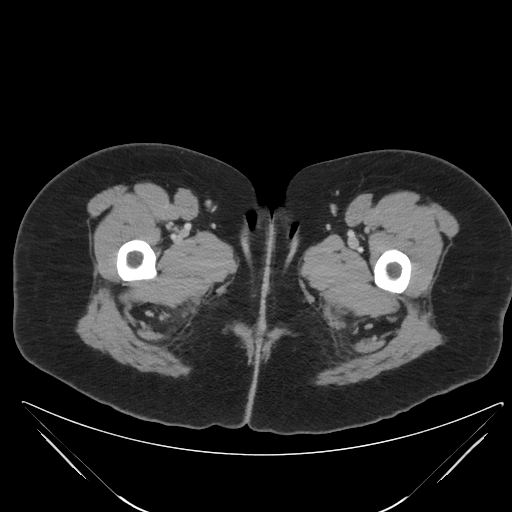
[im 5/102  bone]
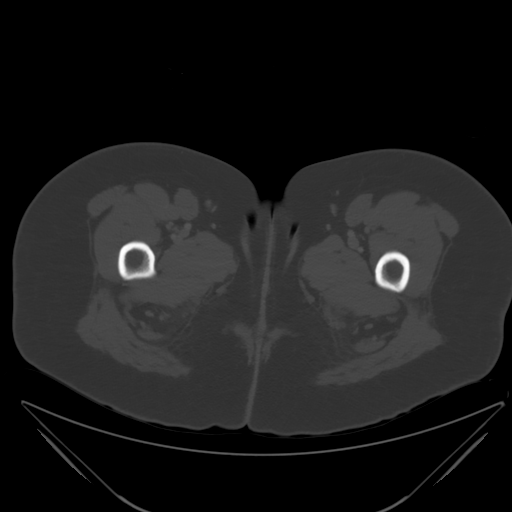
[im 13/102  soft-tissue]
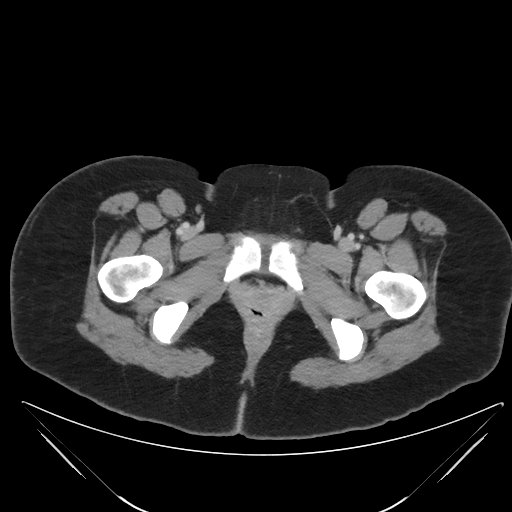
[im 21/102  soft-tissue]
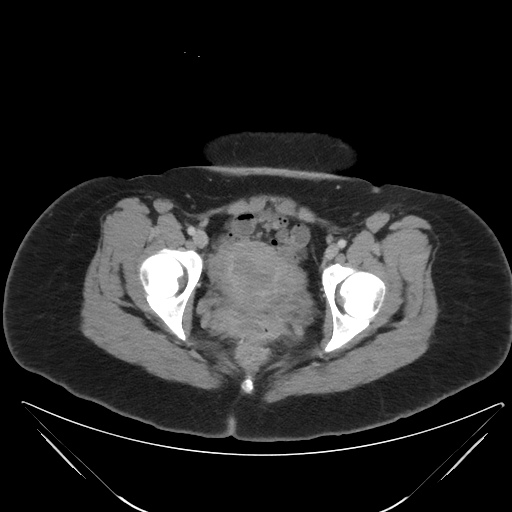
[im 29/102  soft-tissue]
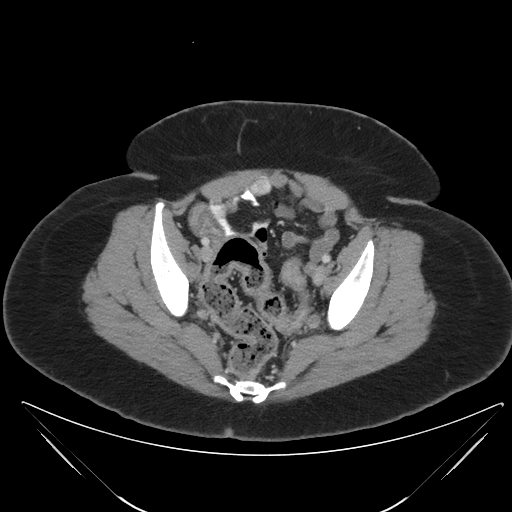
[im 33/102  soft-tissue]
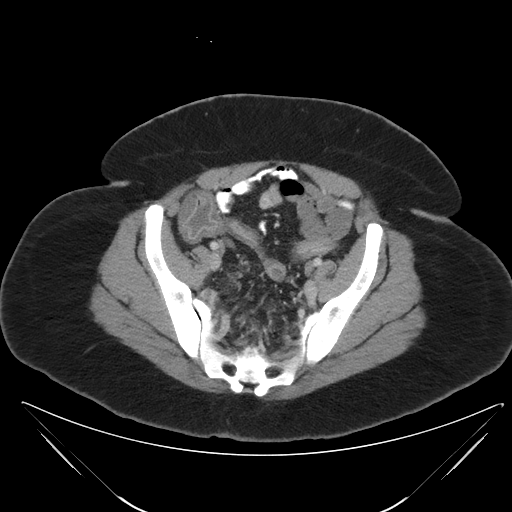
[im 41/102  soft-tissue]
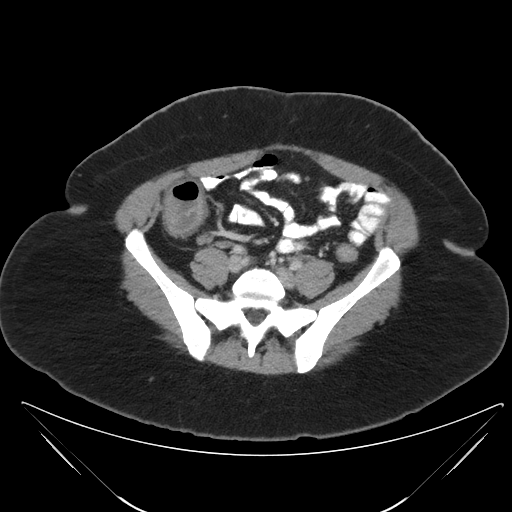
[im 49/102  soft-tissue]
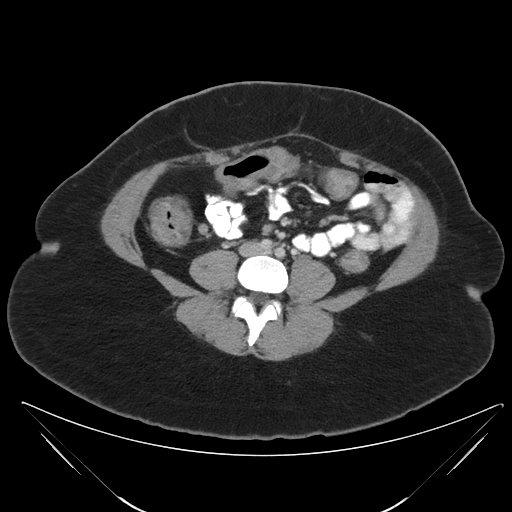
[im 53/102  soft-tissue]
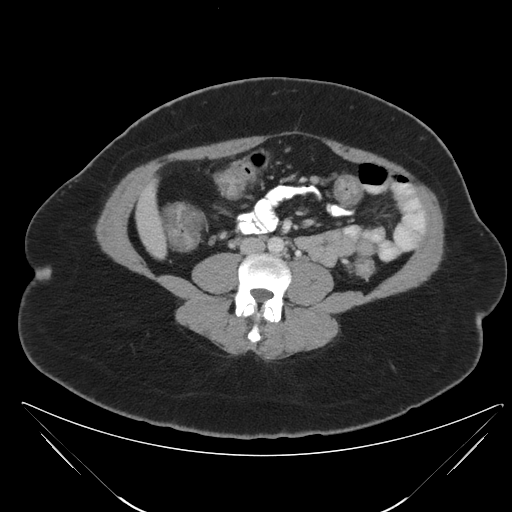
[im 61/102  soft-tissue]
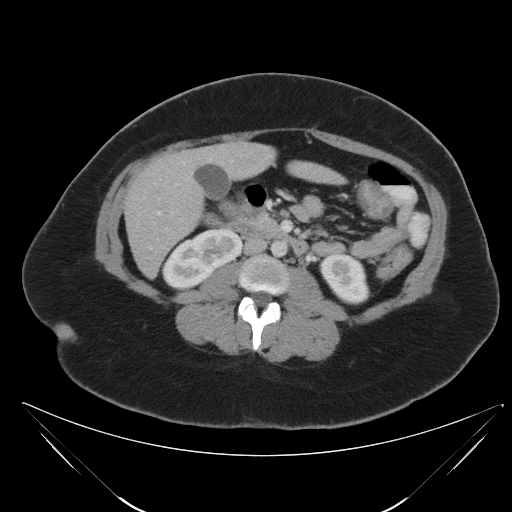
[im 61/102  bone]
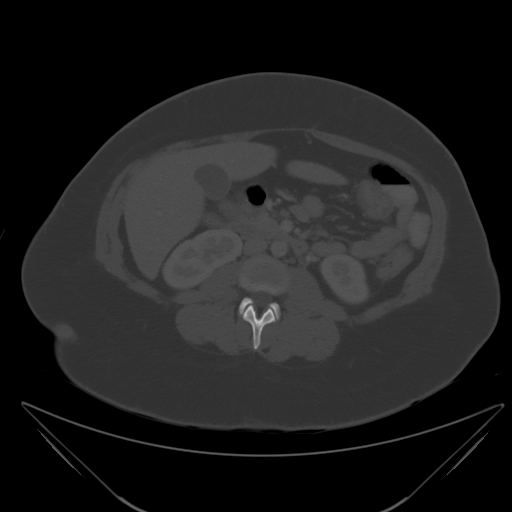
[im 69/102  soft-tissue]
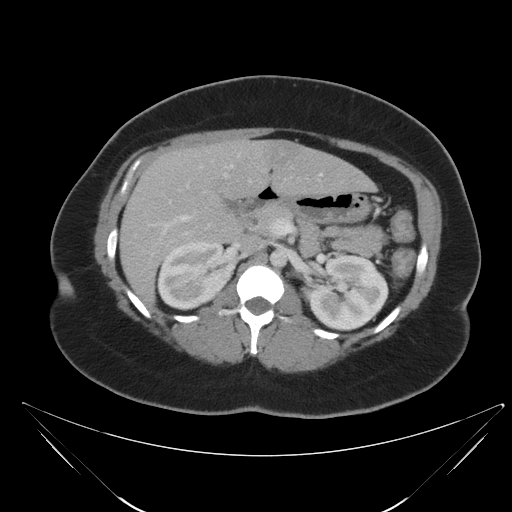
[im 77/102  soft-tissue]
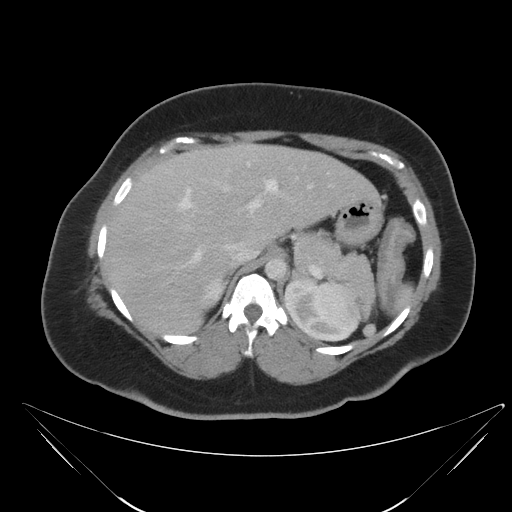
[im 81/102  soft-tissue]
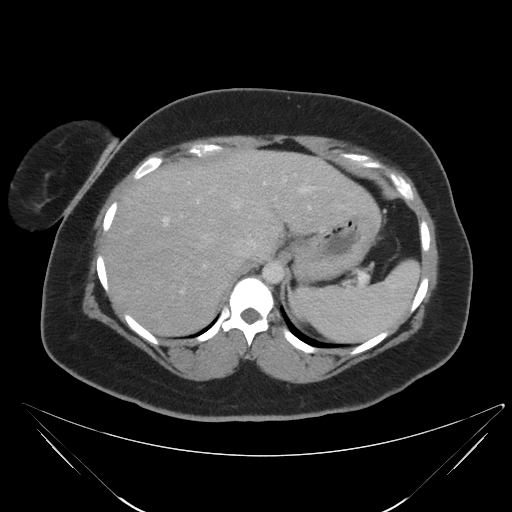
[im 89/102  soft-tissue]
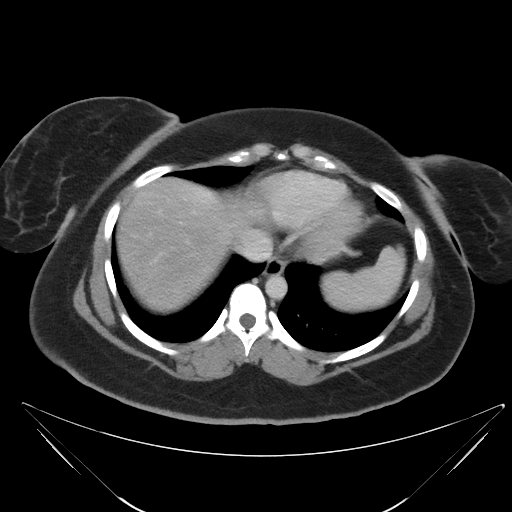
[im 97/102  soft-tissue]
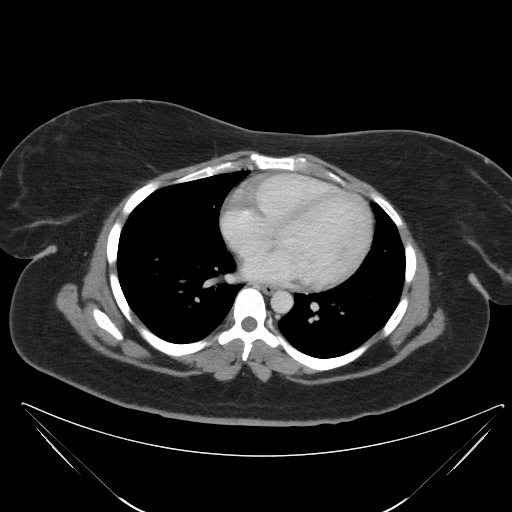

[Series 8048: mpr, coronals, coronal · coronal · 0.99mm/px · 3 of 115 slices shown]
[im 39/115  soft-tissue]
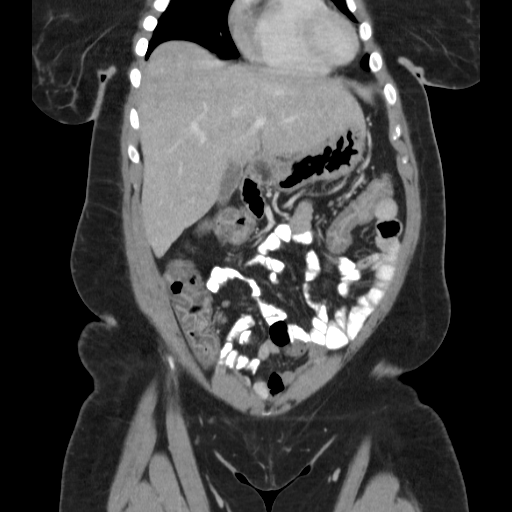
[im 51/115  soft-tissue]
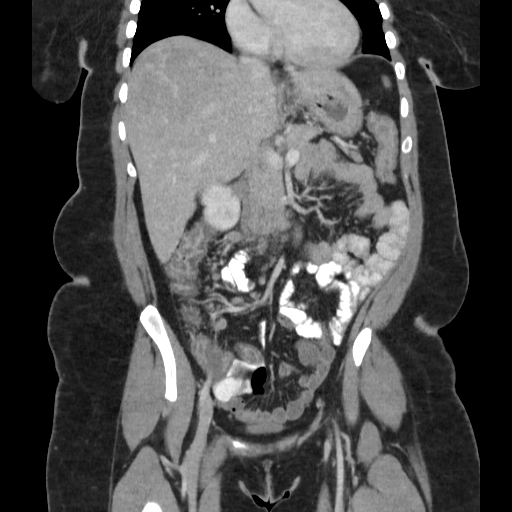
[im 64/115  soft-tissue]
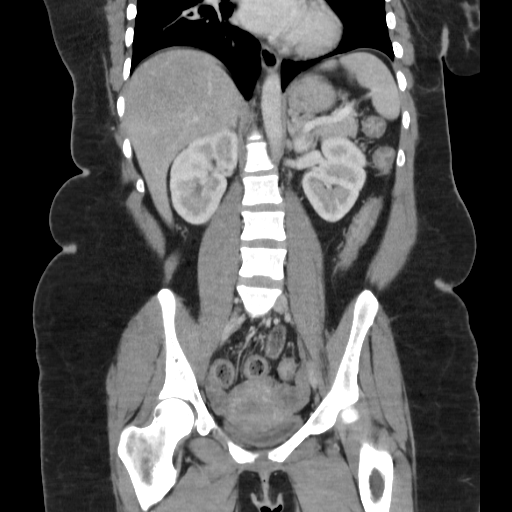

[17 of 46 positions shown; findings below may reference images not displayed]

FINDINGS: Lung bases are clear.  No pericardial fluid.

There is a low-density lesion along the falciform ligament likely
representing focal fatty infiltration. No biliary duct dilatation.
The gallbladder, pancreas, spleen, adrenal glands, and kidneys are
normal.

Stomach, duodenum, and small bowel are normal. There is some mild
bowel wall edema involving the terminal ileum (image 67 to 70). This
mild bowel wall edema continues into the cecum. There is mucosal
thickening of the ascending, transverse, and descending colon and
sigmoid colon. . The rectum appears normal. The colon is not
distended and is predominantly collapsed throughout its course.

There small lymph nodes in the ileocecal mesentery measuring 7 to 8
mm short axis (60 for 65 for example). No central mesenteric
adenopathy. No retroperitoneal adenopathy.

No free fluid the pelvis. The bladder and uterus are normal. Next
are normal. No pelvic lymphadenopathy. No aggressive osseous lesion.
IMPRESSION: 1. Bowel wall edema and mild mucosal thickening involving the
terminal ileum and the majority of the colon (pancolitis) with some
sparing of the rectum. Differential consideration include
inflammatory and infectious enteritis / colitis including
inflammatory bowel disease. Also consider collagen vascular disease.
Ischemic colitis would be unlikely.
2. Ileocecal adenopathy is likely reactive.

## 2016-01-11 NOTE — Telephone Encounter (Signed)
[facility] homehealth nurse cannot continue to pick up Cimzia at the office and administer it to Pt. [place] with C[facility] Angela Nevin) cannot get pt to return her call about having cimzia delivered to pts home or pt picking up med from our office. Emergency contact phone number given to Angela Nevin to try, it is listed as pts mother. Angela Nevin states she will call our office back and let us know how to proceed with cimzia. Will need to know where to have [facility] ship the medication, pt will be due for Cimzia in 3 weeks. Dr. Hilarie Fredrickson notified.

## 2016-01-24 ENCOUNTER — Telehealth: Payer: Self-pay

## 2016-01-24 NOTE — Telephone Encounter (Signed)
-----   Message from Algernon Huxley, RN sent at 01/19/2016  9:07 AM EDT ----- Regarding: Cimzia Order Cimzia and have shipped to her home.

## 2016-01-24 NOTE — Telephone Encounter (Signed)
Cimzia ordered to be shipped to pts home. [facility] aware and know pts new home address.

## 2016-02-16 ENCOUNTER — Telehealth: Payer: Self-pay

## 2016-02-16 NOTE — Telephone Encounter (Signed)
Called [facility] and spoke with Bill to order Cimzia for pt. Med to be delivered by Monday 02/21/16 to pts home.

## 2016-03-13 ENCOUNTER — Telehealth: Payer: Self-pay | Admitting: *Deleted

## 2016-03-13 NOTE — Telephone Encounter (Signed)
I have left a voicemail for patient to call back. She needs to have a follow up office visit. She was to follow up 6 months from her last appointment 02/22/2015 but no showed that appointment and did not reschedule. She also needs TB skin testing to continue Cimzia.

## 2016-03-13 NOTE — Telephone Encounter (Signed)
Patient has scheduled an appointment for follow up with Dr Hilarie Fredrickson for tomorrow and will have TB testing completed at that time.

## 2016-03-13 NOTE — Telephone Encounter (Signed)
Patient returned phone call. °

## 2016-03-14 ENCOUNTER — Encounter: Payer: Self-pay | Admitting: Internal Medicine

## 2016-03-14 ENCOUNTER — Other Ambulatory Visit (INDEPENDENT_AMBULATORY_CARE_PROVIDER_SITE_OTHER): Payer: Medicaid Other

## 2016-03-14 ENCOUNTER — Ambulatory Visit (INDEPENDENT_AMBULATORY_CARE_PROVIDER_SITE_OTHER): Payer: Medicaid Other | Admitting: Internal Medicine

## 2016-03-14 VITALS — BP 112/86 | HR 80 | Ht 65.0 in | Wt 266.4 lb

## 2016-03-14 DIAGNOSIS — Z79899 Other long term (current) drug therapy: Secondary | ICD-10-CM | POA: Diagnosis not present

## 2016-03-14 DIAGNOSIS — K50819 Crohn's disease of both small and large intestine with unspecified complications: Secondary | ICD-10-CM

## 2016-03-14 LAB — CBC WITH DIFFERENTIAL/PLATELET
Basophils Absolute: 0.1 10*3/uL (ref 0.0–0.1)
Basophils Relative: 0.9 % (ref 0.0–3.0)
EOS PCT: 3.3 % (ref 0.0–5.0)
Eosinophils Absolute: 0.3 10*3/uL (ref 0.0–0.7)
HCT: 40 % (ref 36.0–46.0)
Hemoglobin: 13.6 g/dL (ref 12.0–15.0)
LYMPHS ABS: 3.5 10*3/uL (ref 0.7–4.0)
Lymphocytes Relative: 40.7 % (ref 12.0–46.0)
MCHC: 34.2 g/dL (ref 30.0–36.0)
MCV: 93.4 fl (ref 78.0–100.0)
MONO ABS: 0.9 10*3/uL (ref 0.1–1.0)
MONOS PCT: 10.5 % (ref 3.0–12.0)
NEUTROS ABS: 3.8 10*3/uL (ref 1.4–7.7)
NEUTROS PCT: 44.6 % (ref 43.0–77.0)
PLATELETS: 282 10*3/uL (ref 150.0–400.0)
RBC: 4.28 Mil/uL (ref 3.87–5.11)
RDW: 13.8 % (ref 11.5–15.5)
WBC: 8.6 10*3/uL (ref 4.0–10.5)

## 2016-03-14 LAB — COMPREHENSIVE METABOLIC PANEL
ALK PHOS: 55 U/L (ref 39–117)
ALT: 18 U/L (ref 0–35)
AST: 13 U/L (ref 0–37)
Albumin: 4.4 g/dL (ref 3.5–5.2)
BUN: 13 mg/dL (ref 6–23)
CHLORIDE: 102 meq/L (ref 96–112)
CO2: 26 meq/L (ref 19–32)
Calcium: 9.6 mg/dL (ref 8.4–10.5)
Creatinine, Ser: 0.58 mg/dL (ref 0.40–1.20)
GFR: 156.13 mL/min (ref >60.00)
GLUCOSE: 101 mg/dL — AB (ref 70–99)
POTASSIUM: 4 meq/L (ref 3.5–5.1)
SODIUM: 136 meq/L (ref 135–145)
Total Bilirubin: 0.4 mg/dL (ref 0.2–1.2)
Total Protein: 7.6 g/dL (ref 6.0–8.3)

## 2016-03-14 LAB — VITAMIN B12: VITAMIN B 12: 378 pg/mL (ref 211–911)

## 2016-03-14 LAB — HIGH SENSITIVITY CRP: CRP HIGH SENSITIVITY: 2.62 mg/L (ref 0.000–5.000)

## 2016-03-14 LAB — IBC PANEL
Iron: 76 ug/dL (ref 42–145)
SATURATION RATIOS: 15.8 % — AB (ref 20.0–50.0)
Transferrin: 343 mg/dL (ref 212.0–360.0)

## 2016-03-14 LAB — FERRITIN: FERRITIN: 14 ng/mL (ref 10.0–291.0)

## 2016-03-14 MED ORDER — NA SULFATE-K SULFATE-MG SULF 17.5-3.13-1.6 GM/177ML PO SOLN: ORAL | 0 refills | Status: DC

## 2016-03-14 NOTE — Progress Notes (Signed)
Subjective:    Patient ID: Julie Mcconnell, female    DOB: [birth year], 30 y.o.   MRN: [id number]  HPI Julie Mcconnell is a 30 year old female with history of ileocolonic Crohn's disease diagnosed in January 2015 who is here for follow-up. She was last seen one year ago and has been maintained on Cimzia 400 mg every month. She is here alone today. She reports that she is definitively better than she was previously prior to Cimzia. Earlier this year due to inability to maintain communication with specialty pharmacy and the nurse to administers her medicine at her home there was a lapse in therapy. During this period she had more abdominal pain loose stools which at times were bloody. Now she states her symptoms for the most part are well-controlled. She feels like prior to getting her Cimzia injection she gets more diarrhea. On Sunday she can have 6-8 bowel movements per day associated with lower and right-sided crampy abdominal pain. She denies seeing blood in the stool or melena. Her energy levels are better than they were previously. She does continue to smoke tobacco she states 1 pack of cigarettes will last her 1-1/2 days. This is an overall reduction in smoking for her. She denies upper GI hepatobiliary complaint. Denies rash, changes in vision and new joint pains.  Review of Systems As per history of present illness, otherwise negative  Current Medications, Allergies, Past Medical History, Past Surgical History, Family History and Social History were reviewed in [employer] record.     Objective:   Physical Exam BP 112/86 (BP Location: Left Arm, Patient Position: Sitting, Cuff Size: Normal)   Pulse 80   Ht 5' 5"  (1.651 m) Comment: height measured without shoes  Wt 266 lb 6 oz (120.8 kg)   BMI 44.33 kg/m  Constitutional: Well-developed and well-nourished. No distress. HEENT: Normocephalic and atraumatic. Oropharynx is clear and moist. No oropharyngeal exudate.  Conjunctivae are normal.  No scleral icterus. Neck: Neck supple. Trachea midline. Cardiovascular: Normal rate, regular rhythm and intact distal pulses. No M/R/G Pulmonary/chest: Effort normal and breath sounds normal. No wheezing, rales or rhonchi. Abdominal: Soft, nontender, nondistended. Bowel sounds active throughout. Extremities: no clubbing, cyanosis, or edema Lymphadenopathy: No cervical adenopathy noted. Neurological: Alert and oriented to person place and time. Skin: Skin is warm and dry. No rashes noted. Psychiatric: Normal mood and affect. Behavior is normal.  Labs pending today     Assessment & Plan:  30 year old female with history of ileocolonic Crohn's disease diagnosed in January 2015 who is here for follow-up.  1. Ileocolonic Crohn's disease -- her response to Cimzia clinically has been good. I have recommended repeat colonoscopy to evaluate disease activity both in the terminal ileum and colon. Her last exam was January 2015. We may need to change Cimzia dosing to every 3 weeks if she is developing recurrent symptoms of her colitis prior to re-dosing. We will evaluate disease activity at colonoscopy. We discussed the risks, benefits and alternatives and she is agreeable to proceed. I recommended she continue Cimzia 400 mg every 4 weeks for now. Also recommended that she stop smoking. Will flu vaccine recommended. --We had adherence issues in the past but currently it seems that she is using Cimzia as directed. I stressed the importance of office follow-up when requested and recommended along with labs for disease monitoring. --CMP, CBC, iron studies, CRP, quantiferon gold, B12 today  25 minutes spent with the patient today. Greater than 50% was spent in counseling and  coordination of care with the patient

## 2016-03-14 NOTE — Patient Instructions (Signed)
You have been scheduled for a colonoscopy. Please follow written instructions given to you at your visit today.  Please pick up your prep supplies at the pharmacy within the next 1-3 days. If you use inhalers (even only as needed), please bring them with you on the day of your procedure. Your physician has requested that you go to www.startemmi.com and enter the access code given to you at your visit today. This web site gives a general overview about your procedure. However, you should still follow specific instructions given to you by our office regarding your preparation for the procedure.  Your physician has requested that you go to the basement for the following lab work before leaving today: CBC, CMP, Quant gold, IBC, Ferritin, B12, CRP   If you are age 19 or older, your body mass index should be between 23-30. Your Body mass index is 44.33 kg/m. If this is out of the aforementioned range listed, please consider follow up with your Primary Care Provider.  If you are age 23 or younger, your body mass index should be between 19-25. Your Body mass index is 44.33 kg/m. If this is out of the aformentioned range listed, please consider follow up with your Primary Care Provider.

## 2016-03-16 ENCOUNTER — Telehealth: Payer: Self-pay | Admitting: Internal Medicine

## 2016-03-16 LAB — QUANTIFERON TB GOLD ASSAY (BLOOD)
Interferon Gamma Release Assay: NEGATIVE
QUANTIFERON NIL VALUE: 0.04 [IU]/mL
QUANTIFERON TB AG MINUS NIL: 0.08 [IU]/mL

## 2016-03-16 NOTE — Telephone Encounter (Signed)
FYI

## 2016-04-04 ENCOUNTER — Encounter: Payer: Medicaid Other | Admitting: Internal Medicine

## 2016-04-13 ENCOUNTER — Telehealth: Payer: Self-pay | Admitting: Internal Medicine

## 2016-04-13 NOTE — Telephone Encounter (Signed)
Is available and covered by pharmaceutical assistance program we can switch back to pre-fill syringes at the same dose and dosing interval

## 2016-04-13 NOTE — Telephone Encounter (Signed)
Dr Hilarie Fredrickson please advise as to prefilled syringes versus powder??

## 2016-04-13 NOTE — Telephone Encounter (Signed)
Left message on machine to call back  

## 2016-04-17 NOTE — Telephone Encounter (Signed)
Called and left message for [name] on her voicemail that if approved by the assistance program ok to switch to prefilled syringes.

## 2016-04-20 ENCOUNTER — Telehealth: Payer: Self-pay

## 2016-04-20 ENCOUNTER — Telehealth: Payer: Self-pay | Admitting: Internal Medicine

## 2016-04-20 NOTE — Telephone Encounter (Signed)
Prior auth obtained for Cimzia from [employer] from 04/19/16-12/1-18 PA number [id number].

## 2016-04-20 NOTE — Telephone Encounter (Signed)
Left message on machine to call back  

## 2016-04-21 NOTE — Telephone Encounter (Signed)
Copy of medicaid card and form faxed as requested.

## 2016-05-01 ENCOUNTER — Telehealth: Payer: Self-pay | Admitting: Internal Medicine

## 2016-05-01 NOTE — Telephone Encounter (Signed)
We need to have this rescheduled

## 2016-05-01 NOTE — Telephone Encounter (Signed)
Patient has been rescheduled for 05/19/16 at 3:00pm.

## 2016-05-02 ENCOUNTER — Encounter: Payer: Medicaid Other | Admitting: Internal Medicine

## 2016-05-19 ENCOUNTER — Encounter: Payer: Medicaid Other | Admitting: Internal Medicine

## 2016-05-30 ENCOUNTER — Institutional Professional Consult (permissible substitution): Payer: Medicaid Other | Admitting: Internal Medicine

## 2016-09-02 ENCOUNTER — Encounter (HOSPITAL_COMMUNITY): Payer: Self-pay | Admitting: Emergency Medicine

## 2016-09-02 ENCOUNTER — Ambulatory Visit (HOSPITAL_COMMUNITY)
Admission: EM | Admit: 2016-09-02 | Discharge: 2016-09-02 | Disposition: A | Discharge status: Home / Self Care | Payer: Medicaid Other | Attending: Family Medicine | Admitting: Family Medicine

## 2016-09-02 DIAGNOSIS — Z8249 Family history of ischemic heart disease and other diseases of the circulatory system: Secondary | ICD-10-CM | POA: Insufficient documentation

## 2016-09-02 DIAGNOSIS — J029 Acute pharyngitis, unspecified: Secondary | ICD-10-CM

## 2016-09-02 DIAGNOSIS — N898 Other specified noninflammatory disorders of vagina: Secondary | ICD-10-CM | POA: Diagnosis not present

## 2016-09-02 DIAGNOSIS — H6502 Acute serous otitis media, left ear: Secondary | ICD-10-CM

## 2016-09-02 DIAGNOSIS — R319 Hematuria, unspecified: Secondary | ICD-10-CM | POA: Diagnosis not present

## 2016-09-02 DIAGNOSIS — F419 Anxiety disorder, unspecified: Secondary | ICD-10-CM | POA: Diagnosis not present

## 2016-09-02 DIAGNOSIS — R300 Dysuria: Secondary | ICD-10-CM

## 2016-09-02 DIAGNOSIS — Z79899 Other long term (current) drug therapy: Secondary | ICD-10-CM | POA: Diagnosis not present

## 2016-09-02 DIAGNOSIS — F319 Bipolar disorder, unspecified: Secondary | ICD-10-CM | POA: Insufficient documentation

## 2016-09-02 DIAGNOSIS — N6452 Nipple discharge: Secondary | ICD-10-CM | POA: Diagnosis not present

## 2016-09-02 DIAGNOSIS — Z888 Allergy status to other drugs, medicaments and biological substances status: Secondary | ICD-10-CM | POA: Insufficient documentation

## 2016-09-02 DIAGNOSIS — K219 Gastro-esophageal reflux disease without esophagitis: Secondary | ICD-10-CM | POA: Diagnosis not present

## 2016-09-02 DIAGNOSIS — Z9889 Other specified postprocedural states: Secondary | ICD-10-CM | POA: Diagnosis not present

## 2016-09-02 DIAGNOSIS — Z3202 Encounter for pregnancy test, result negative: Secondary | ICD-10-CM

## 2016-09-02 DIAGNOSIS — N390 Urinary tract infection, site not specified: Secondary | ICD-10-CM | POA: Diagnosis not present

## 2016-09-02 DIAGNOSIS — F17210 Nicotine dependence, cigarettes, uncomplicated: Secondary | ICD-10-CM | POA: Diagnosis not present

## 2016-09-02 LAB — POCT URINALYSIS DIP (DEVICE)
BILIRUBIN URINE: NEGATIVE
GLUCOSE, UA: NEGATIVE mg/dL
Hgb urine dipstick: NEGATIVE
Ketones, ur: NEGATIVE mg/dL
Nitrite: NEGATIVE
PROTEIN: 30 mg/dL — AB
Specific Gravity, Urine: 1.02 (ref 1.005–1.030)
Urobilinogen, UA: 2 mg/dL — ABNORMAL HIGH (ref 0.0–1.0)
pH: 6.5 (ref 5.0–8.0)

## 2016-09-02 LAB — POCT PREGNANCY, URINE: PREG TEST UR: NEGATIVE

## 2016-09-02 MED ORDER — METRONIDAZOLE 500 MG PO TABS: 500.0000 mg | ORAL_TABLET | Freq: Two times a day (BID) | ORAL | 0 refills | Status: DC

## 2016-09-02 MED ORDER — AZITHROMYCIN 250 MG PO TABS
1000.0000 mg | ORAL_TABLET | Freq: Once | ORAL | Status: AC
  Administered 2016-09-02: 1000 mg via ORAL

## 2016-09-02 MED ORDER — CEFTRIAXONE SODIUM 250 MG IJ SOLR
INTRAMUSCULAR | Status: AC
  Filled 2016-09-02: qty 250

## 2016-09-02 MED ORDER — LIDOCAINE HCL (PF) 1 % IJ SOLN
INTRAMUSCULAR | Status: AC
  Filled 2016-09-02: qty 2

## 2016-09-02 MED ORDER — AMOXICILLIN 875 MG PO TABS
875.0000 mg | ORAL_TABLET | Freq: Two times a day (BID) | ORAL | 0 refills | Status: DC
Start: 2016-09-02 — End: 2017-07-12

## 2016-09-02 MED ORDER — CEFTRIAXONE SODIUM 250 MG IJ SOLR
250.0000 mg | Freq: Once | INTRAMUSCULAR | Status: AC
  Administered 2016-09-02: 250 mg via INTRAMUSCULAR

## 2016-09-02 MED ORDER — PHENAZOPYRIDINE HCL 200 MG PO TABS: 200.0000 mg | ORAL_TABLET | Freq: Three times a day (TID) | ORAL | 0 refills | Status: DC

## 2016-09-02 MED ORDER — FLUCONAZOLE 150 MG PO TABS: 150.0000 mg | ORAL_TABLET | Freq: Every day | ORAL | 1 refills | Status: DC

## 2016-09-02 MED ORDER — AZITHROMYCIN 250 MG PO TABS
ORAL_TABLET | ORAL | Status: AC
  Filled 2016-09-02: qty 4

## 2016-09-02 MED ORDER — IPRATROPIUM BROMIDE 0.06 % NA SOLN: 2.0000 | Freq: Four times a day (QID) | NASAL | 0 refills | Status: DC

## 2016-09-02 NOTE — Discharge Instructions (Signed)
Follow up with [facility] to establish PCP and for nipple discharge.  Nipple discharge is normal for some females after pregnancy.

## 2016-09-02 NOTE — ED Notes (Signed)
Verified phone number

## 2016-09-02 NOTE — ED Provider Notes (Signed)
CSN: [id number]     Arrival date & time 09/02/16  1307 History   None    Chief Complaint  Patient presents with  . Sore Throat  . Breast Discharge   (Consider location/radiation/quality/duration/timing/severity/associated sxs/prior Treatment) Patient c/o sore throat, nipple discharge, vaginal discharge, left ear pain, dysuria, and congestion.  She has had 2 days of dysuria.  She has had URI for over a week. She has been having foul odor white vaginal discharge.  She is sexually active and does not use protection.  She states she thinks she may have trich and / or BV.     The history is provided by the patient.  Sore Throat  This is a new problem. The problem occurs constantly. The problem has not changed since onset.Nothing aggravates the symptoms.    Past Medical History:  Diagnosis Date  . Anxiety   . Bipolar disorder ([place])   . Crohn's colitis ([place])   . Depression   . GERD (gastroesophageal reflux disease)    occasional w/pregnancy, diet controlled, no meds  . History of chronic bronchitis 2012   last episode 3-4 yrs ago   . History of concussion    child--  no residual  . SVD (spontaneous vaginal delivery)    x 3   Past Surgical History:  Procedure Laterality Date  . CESAREAN SECTION  01-26-2005  . CESAREAN SECTION WITH BILATERAL TUBAL LIGATION Bilateral 11/05/2014   Procedure: CESAREAN SECTION WITH BILATERAL TUBAL LIGATION;  Surgeon: Shelly Bombard, MD;  Location: [facility];  Service: Obstetrics;  Laterality: Bilateral;  . INNER EAR SURGERY Right as child  . LASER ABLATION CONDOLAMATA N/A 08/25/2013   Procedure: LASER ABLATION CONDOLAMATA/ANAL EXAM UNDER ANESTHESIA;  Surgeon: Leighton Ruff, MD;  Location: [facility];  Service: General;  Laterality: N/A;  . TONSILLECTOMY  as child  . TUBAL LIGATION  11/05/14  . tubes in ear     as child  . UMBILICAL HERNIA REPAIR  age 38  . WISDOM TOOTH EXTRACTION     Family History  Problem Relation Age of Onset  .  Hypertension Mother   . Heart disease Maternal Grandmother    Social History  Substance Use Topics  . Smoking status: Current Every Day Smoker    Packs/day: 1.00    Years: 10.00    Types: Cigarettes  . Smokeless tobacco: Never Used  . Alcohol use 0.6 oz/week    1 Cans of beer per week     Comment: From time to time even during pregnancy; patient states she last had a drink 06-02-14   OB History    Gravida Para Term Preterm AB Living   _0 0 0 4   SAB TAB Ectopic Multiple Live Births   0 0 0 0 4     Review of Systems  Constitutional: Positive for fatigue.  HENT: Positive for congestion and ear pain.   Eyes: Negative.   Respiratory: Negative.   Endocrine: Negative.   Genitourinary: Positive for dysuria and vaginal discharge.  Allergic/Immunologic: Negative.   Neurological: Negative.     Allergies  Remicade [infliximab]  Home Medications   Prior to Admission medications   Medication Sig Start Date End Date Taking? Authorizing Provider  amoxicillin (AMOXIL) 875 MG tablet Take 1 tablet (875 mg total) by mouth 2 (two) times daily. 09/02/16   Lysbeth Penner, FNP  Certolizumab Pegol 2 X 200 MG/ML KIT Administer 400 mg (noth vials) subcutaneously every 4 weeks. MUST HAVE OFFICE  VISIT FOR FURTHER REFILLS! Patient taking differently: Administer 400 mg (noth vials) subcutaneously every 4 weeks. 08/26/15   Jerene Bears, MD  fluconazole (DIFLUCAN) 150 MG tablet Take 1 tablet (150 mg total) by mouth daily. 09/02/16   Lysbeth Penner, FNP  ipratropium (ATROVENT) 0.06 % nasal spray Place 2 sprays into both nostrils 4 (four) times daily. 09/02/16   Lysbeth Penner, FNP  metroNIDAZOLE (FLAGYL) 500 MG tablet Take 1 tablet (500 mg total) by mouth 2 (two) times daily. 09/02/16   Lysbeth Penner, FNP  Na Sulfate-K Sulfate-Mg Sulf 17.5-3.13-1.6 GM/180ML SOLN Suprep-Use as directed 03/14/16   Jerene Bears, MD  phenazopyridine (PYRIDIUM) 200 MG tablet Take 1 tablet (200 mg total) by mouth 3  (three) times daily. 09/02/16   Lysbeth Penner, FNP   Meds Ordered and Administered this Visit   Medications  cefTRIAXone (ROCEPHIN) injection 250 mg (250 mg Intramuscular Given 09/02/16 1441)  azithromycin (ZITHROMAX) tablet 1,000 mg (1,000 mg Oral Given 09/02/16 1441)    BP 108/64 (BP Location: Left Arm)   Pulse 92   Temp 98.2 F (36.8 C) (Oral)   Resp 20   LMP 08/06/2016   SpO2 98%   Breastfeeding? No  No data found.   Physical Exam  Urgent Care Course     Procedures (including critical care time)  Labs Review Labs Reviewed  POCT URINALYSIS DIP (DEVICE) - Abnormal; Notable for the following:       Result Value   Protein, ur 30 (*)    Urobilinogen, UA 2.0 (*)    Leukocytes, UA SMALL (*)    All other components within normal limits  URINE CULTURE  POCT PREGNANCY, URINE  URINE CYTOLOGY ANCILLARY ONLY    Imaging Review No results found.   Visual Acuity Review  Right Eye Distance:   Left Eye Distance:   Bilateral Distance:    Right Eye Near:   Left Eye Near:    Bilateral Near:         MDM   1. Acute serous otitis media of left ear, recurrence not specified   2. Acute pharyngitis, unspecified etiology   3. Vaginal discharge   4. Nipple discharge   5. Dysuria   6. Urinary tract infection with hematuria, site unspecified    UA and UA pregnancy UA culture Urine cytology for GC / Chlamydia, trich, BV, yeast  Amoxicillin 874m one po bid x 7 days #14 Flagyl 5022mone po bid x 7 days #14 Diflucan 15031mne po qd #2w/1 rf Atrovent nasal spray Pyridium 200m18me po tid x 2d #6  Push po fluids, rest, tylenol and motrin otc prn as directed for fever, arthralgias, and myalgias.  Follow up prn if sx's continue or persist.  Referral to PCP / [facility] 09/02/16 1512

## 2016-09-02 NOTE — ED Triage Notes (Signed)
Here for ST onset 4 days associated left ear pain, fevers  Also c/o clear bilateral nipple d/c... Has a 6 month old daughter.   A&O x4.. NAD

## 2016-09-04 LAB — URINE CYTOLOGY ANCILLARY ONLY
Chlamydia: NEGATIVE
Neisseria Gonorrhea: NEGATIVE
Trichomonas: NEGATIVE

## 2016-09-06 ENCOUNTER — Telehealth: Payer: Self-pay | Admitting: Internal Medicine

## 2016-09-06 LAB — URINE CULTURE: Culture: >=100000 — AB

## 2016-09-06 LAB — URINE CYTOLOGY ANCILLARY ONLY
Bacterial vaginitis: POSITIVE — AB
Candida vaginitis: NEGATIVE

## 2016-09-06 MED ORDER — SULFAMETHOXAZOLE-TRIMETHOPRIM 800-160 MG PO TABS: 1.0000 | ORAL_TABLET | Freq: Two times a day (BID) | ORAL | 0 refills | Status: AC

## 2016-09-06 NOTE — Telephone Encounter (Signed)
Clinical staff, please let patient know that urine culture is positive for E coli germ, sensitive to trimethoprim/sulfa but resistant to amoxicillin rx given at the urgent care visit 4/21.  Stop amoxicillin.  Rx trimethoprim/sulfa sent to pharmacy of record, [facility] on [address].   Test for gardnerella (bacterial vaginosis) was also positive; rx metronidazole was given at the urgent care visit. Recheck for further evaluation if symptoms are not improving.  LM

## 2017-02-26 ENCOUNTER — Telehealth: Payer: Self-pay | Admitting: Internal Medicine

## 2017-02-26 NOTE — Telephone Encounter (Signed)
Would recommend visit with APP this week so that she can be seen and evaluated sooner than scheduled visit with me Pain meds can be considered at that appointment if felt appropriate based on the clinical findings at office visit

## 2017-02-26 NOTE — Telephone Encounter (Signed)
Pt states she is having severe stomach pain that comes and goes, reports it is a sharp pain that may happen for 10 min then ease off and come back for 34mn. Discussed with pt that she needs to be seen, pt has appt scheduled for 03/14/17 and states she is going to keep this but needs something for pain prior to that appt. Pt states she should not have to go on the "black market" to get something for pain. Reports the pain is like it was when she was first diagnosed. States if she can't get something for pain she is just going to have to find another doctor. Please advise.

## 2017-02-27 NOTE — Telephone Encounter (Signed)
Pt scheduled to see Tye Savoy NP 03/02/17@3pm . Pt instructed to call back if she cannot make the appointment.

## 2017-03-02 ENCOUNTER — Ambulatory Visit: Payer: Medicaid Other | Admitting: Nurse Practitioner

## 2017-03-14 ENCOUNTER — Ambulatory Visit: Payer: Self-pay | Admitting: Internal Medicine

## 2017-04-17 ENCOUNTER — Other Ambulatory Visit: Payer: Self-pay | Admitting: Internal Medicine

## 2017-04-17 NOTE — Telephone Encounter (Signed)
Pt scheduled for OV with Dr. Hilarie Fredrickson in late December. Cimzia refilled X1 until pt is seen in office.

## 2017-05-04 ENCOUNTER — Ambulatory Visit: Payer: Self-pay | Admitting: Internal Medicine

## 2017-06-28 ENCOUNTER — Ambulatory Visit: Payer: Medicaid Other | Admitting: Internal Medicine

## 2017-06-28 ENCOUNTER — Encounter: Payer: Self-pay | Admitting: Internal Medicine

## 2017-06-28 ENCOUNTER — Other Ambulatory Visit: Payer: Self-pay

## 2017-06-28 ENCOUNTER — Encounter (INDEPENDENT_AMBULATORY_CARE_PROVIDER_SITE_OTHER): Payer: Self-pay

## 2017-06-28 VITALS — BP 126/84 | HR 76 | Ht 65.0 in | Wt 313.1 lb

## 2017-06-28 DIAGNOSIS — K5090 Crohn's disease, unspecified, without complications: Secondary | ICD-10-CM

## 2017-06-28 DIAGNOSIS — K5080 Crohn's disease of both small and large intestine without complications: Secondary | ICD-10-CM

## 2017-06-28 MED ORDER — NA SULFATE-K SULFATE-MG SULF 17.5-3.13-1.6 GM/177ML PO SOLN: 1.0000 | ORAL | 0 refills | Status: DC

## 2017-06-28 NOTE — Progress Notes (Signed)
Subjective:    Patient ID: Julie Mcconnell, female    DOB: 1985/06/11, 32 y.o.   MRN: [id number]  HPI Julie Mcconnell is a 32 year old female with a history of ileocolonic Crohn's disease diagnosed in January 2015 who is here for follow-up.  She was last seen on 03/14/2016.  She has been maintained on Cimzia 400 mg every 28 days.  She has missed multiple follow-up appointments and also canceled and rescheduled and then canceled again her colonoscopy recommended at her last visit to assess disease activity.  She states that she has a lot of stress in her life including a custody battle with the father of 1 of her children.  She has 5 children, 4 of whom live with her.  Her oldest is 32 years old.  She states that she has been compliant with her Cimzia which she has been injecting every 28 days.  Her last injection was on 06/10/2017.  For the last 3 months she has been having intense burning pain with each injection and also some injection site redness and discomfort.  This was not previously the problem.  She is having frequent issues with mid and lower abdominal pain worse on the right than left.  She is having bouts of diarrhea and can go as many as 4-6 times per day.  Stools are often loose but she states nonbloody.  It seems that her diarrhea episodes seem to come and go somewhat where she will have 2 weeks with loose stools and the other 2 weeks with more normal stools.  No rashes.  No ocular complaint.  No new joint pain.  Her last colonoscopy was in 2015.  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  This was treated by laser ablation by general surgery.  She is still smoking cigarettes daily.  Review of Systems As per HPI, otherwise negative  Current Medications, Allergies, Past Medical History, Past Surgical History, Family History and Social History were reviewed in [employer] record.     Objective:   Physical Exam BP 126/84   Pulse 76   Ht 5' 5"   (1.651 m)   Wt (!) 313 lb 2 oz (142 kg)   BMI 52.11 kg/m  Constitutional: Well-developed and well-nourished. No distress. HEENT: Normocephalic and atraumatic. Oropharynx is clear and moist. Conjunctivae are normal.  No scleral icterus. Neck: Neck supple. Trachea midline. Cardiovascular: Normal rate, regular rhythm and intact distal pulses. No M/R/G Pulmonary/chest: Effort normal and breath sounds normal. No wheezing, rales or rhonchi. Abdominal: Soft, obese, moderately tender in the mid lower abdomen bilaterally without rebound or guarding, nondistended. Bowel sounds active throughout.Marland Kitchen Extremities: no clubbing, cyanosis, or edema Neurological: Alert and oriented to person place and time. Skin: Skin is warm and dry. Psychiatric: Normal mood and affect. Behavior is normal.      Assessment & Plan:  32 year old female with a history of ileocolonic Crohn's disease diagnosed in January 2015 who is here for follow-up.    1.  Ileocolonic Crohn's disease --unfortunately due to missed appointments and procedures it has been difficult to manage her Crohn's disease the way I would have hoped.  I openly discussed this with her today and she understands my concern but also points to her difficult social situations.  Fortunately she does have Medicaid.  She has seemingly lost response to Cimzia and is also having injection site pain and erythema over the last 3 months.  I am going to switch her to Humira 40  mg every 14 days without induction.  Citrate free version  I have strongly recommended colonoscopy which we will schedule today.  This will need to be in the outpatient hospital setting due to her BMI greater than 50.  We discussed the risk, benefits and alternatives and she is agreeable and wishes to proceed.  I strongly encouraged her to stop smoking as this definitively worsens her Crohn's disease  She needs CBC, CMP, CRP and quantiferon gold  I again discussed her adherence issues and  encouraged her to take medication as directed, come to follow-up appointments as recommended and colonoscopy as scheduled  25 minutes spent with the patient today. Greater than 50% was spent in counseling and coordination of care with the patient

## 2017-06-28 NOTE — Patient Instructions (Addendum)
Stop Cimzia, we will contact you about starting humira.   Your physician has requested that you go to the basement for lab work before leaving today.  You have been scheduled for a colonoscopy. Please follow written instructions given to you at your visit today.  Please pick up your prep supplies at the pharmacy within the next 1-3 days. If you use inhalers (even only as needed), please bring them with you on the day of your procedure. Your physician has requested that you go to www.startemmi.com and enter the access code given to you at your visit today. This web site gives a general overview about your procedure. However, you should still follow specific instructions given to you by our office regarding your preparation for the procedure.

## 2017-07-05 ENCOUNTER — Telehealth: Payer: Self-pay | Admitting: *Deleted

## 2017-07-05 NOTE — Telephone Encounter (Signed)
Called pt and left message for pt to call back to update medical history, to request medical records from past provider.

## 2017-07-09 ENCOUNTER — Other Ambulatory Visit: Payer: Self-pay

## 2017-07-09 DIAGNOSIS — K5090 Crohn's disease, unspecified, without complications: Secondary | ICD-10-CM

## 2017-07-09 NOTE — Progress Notes (Deleted)
Cardiology Office Note   Date:  07/09/2017   ID:  DALIANA Mcconnell, DOB [birth year], MRN [id number]  PCP:  Patient, No Pcp Per  Cardiologist:   No primary care provider on file. Referring:  ***  No chief complaint on file.     History of Present Illness: Julie Mcconnell is a 32 y.o. female who presents referred herself because she has a family history of CAD.  ***     Past Medical History:  Diagnosis Date  . Anxiety   . Bipolar disorder ([place])   . Crohn's colitis ([place])   . Depression   . GERD (gastroesophageal reflux disease)    occasional w/pregnancy, diet controlled, no meds  . History of chronic bronchitis 2012   last episode 3-4 yrs ago   . History of concussion    child--  no residual  . SVD (spontaneous vaginal delivery)    x 3    Past Surgical History:  Procedure Laterality Date  . CESAREAN SECTION  01-26-2005  . CESAREAN SECTION WITH BILATERAL TUBAL LIGATION Bilateral 11/05/2014   Procedure: CESAREAN SECTION WITH BILATERAL TUBAL LIGATION;  Surgeon: Shelly Bombard, MD;  Location: [facility];  Service: Obstetrics;  Laterality: Bilateral;  . INNER EAR SURGERY Right as child  . LASER ABLATION CONDOLAMATA N/A 08/25/2013   Procedure: LASER ABLATION CONDOLAMATA/ANAL EXAM UNDER ANESTHESIA;  Surgeon: Leighton Ruff, MD;  Location: [facility];  Service: General;  Laterality: N/A;  . TONSILLECTOMY  as child  . TUBAL LIGATION  11/05/14  . tubes in ear     as child  . UMBILICAL HERNIA REPAIR  age 57  . WISDOM TOOTH EXTRACTION       Current Outpatient Medications  Medication Sig Dispense Refill  . amoxicillin (AMOXIL) 875 MG tablet Take 1 tablet (875 mg total) by mouth 2 (two) times daily. (Patient not taking: Reported on 06/28/2017) 14 tablet 0  . CIMZIA PREFILLED 2 X 200 MG/ML KIT Inject 2 syringes (227m each) subcutaneously every 4 weeks  Keep Refrigerated 1 kit 0  . fluconazole (DIFLUCAN) 150 MG tablet Take 1 tablet (150 mg total) by mouth daily.  (Patient not taking: Reported on 06/28/2017) 2 tablet 1  . ipratropium (ATROVENT) 0.06 % nasal spray Place 2 sprays into both nostrils 4 (four) times daily. (Patient not taking: Reported on 06/28/2017) 15 mL 0  . metroNIDAZOLE (FLAGYL) 500 MG tablet Take 1 tablet (500 mg total) by mouth 2 (two) times daily. (Patient not taking: Reported on 06/28/2017) 14 tablet 0  . Na Sulfate-K Sulfate-Mg Sulf (SUPREP BOWEL PREP KIT) 17.5-3.13-1.6 GM/177ML SOLN Take 1 kit by mouth as directed. 324 mL 0  . phenazopyridine (PYRIDIUM) 200 MG tablet Take 1 tablet (200 mg total) by mouth 3 (three) times daily. (Patient not taking: Reported on 06/28/2017) 6 tablet 0   No current facility-administered medications for this visit.     Allergies:   Remicade [infliximab]    Social History:  The patient  reports that she has been smoking cigarettes.  She has a 10.00 pack-year smoking history. she has never used smokeless tobacco. She reports that she drinks about 0.6 oz of alcohol per week. She reports that she does not use drugs.   Family History:  The patient's ***family history includes Heart disease in her maternal grandmother; Hypertension in her mother.    ROS:  Please see the history of present illness.   Otherwise, review of systems are positive for {NONE DEFAULTED:18576::"none"}.   All  other systems are reviewed and negative.    PHYSICAL EXAM: VS:  There were no vitals taken for this visit. , BMI There is no height or weight on file to calculate BMI. GENERAL:  Well appearing HEENT:  Pupils equal round and reactive, fundi not visualized, oral mucosa unremarkable NECK:  No jugular venous distention, waveform within normal limits, carotid upstroke brisk and symmetric, no bruits, no thyromegaly LYMPHATICS:  No cervical, inguinal adenopathy LUNGS:  Clear to auscultation bilaterally BACK:  No CVA tenderness CHEST:  Unremarkable HEART:  PMI not displaced or sustained,S1 and S2 within normal limits, no S3, no S4, no  clicks, no rubs, *** murmurs ABD:  Flat, positive bowel sounds normal in frequency in pitch, no bruits, no rebound, no guarding, no midline pulsatile mass, no hepatomegaly, no splenomegaly EXT:  2 plus pulses throughout, no edema, no cyanosis no clubbing SKIN:  No rashes no nodules NEURO:  Cranial nerves II through XII grossly intact, motor grossly intact throughout PSYCH:  Cognitively intact, oriented to person place and time    EKG:  EKG {ACTION; IS/IS FVW:[id number]} ordered today. The ekg ordered today demonstrates ***   Recent Labs: No results found for requested labs within last 8760 hours.    Lipid Panel No results found for: CHOL, TRIG, HDL, CHOLHDL, VLDL, LDLCALC, LDLDIRECT    Wt Readings from Last 3 Encounters:  06/28/17 (!) 313 lb 2 oz (142 kg)  03/14/16 266 lb 6 oz (120.8 kg)  02/22/15 192 lb (87.1 kg)      Other studies Reviewed: Additional studies/ records that were reviewed today include: ***. Review of the above records demonstrates:  Please see elsewhere in the note.  ***   ASSESSMENT AND PLAN:  ***   Current medicines are reviewed at length with the patient today.  The patient {ACTIONS; HAS/DOES NOT HAVE:19233} concerns regarding medicines.  The following changes have been made:  {PLAN; NO CHANGE:13088:s}  Labs/ tests ordered today include: *** No orders of the defined types were placed in this encounter.    Disposition:   FU with ***    Signed, Minus Breeding, MD  07/09/2017 9:08 PM    [facility]

## 2017-07-10 ENCOUNTER — Telehealth: Payer: Self-pay | Admitting: Internal Medicine

## 2017-07-11 NOTE — Telephone Encounter (Signed)
Spoke with Nicki Reaper and let him know we are waiting on pt to come in for her labs before we can prescribe the Humira. He is aware. Also called pt to remind her that she still needs to come and have these drawn.

## 2017-07-12 ENCOUNTER — Ambulatory Visit: Payer: Medicaid Other | Admitting: Cardiology

## 2017-07-17 NOTE — Progress Notes (Signed)
PST, Pre-Op call Spoke to pt regarding her colonoscopy scheduled for 07/24/17, pt reports will have to reschedule and se will call the office to let them know.

## 2017-07-20 ENCOUNTER — Telehealth: Payer: Self-pay | Admitting: Internal Medicine

## 2017-07-20 ENCOUNTER — Other Ambulatory Visit: Payer: Self-pay

## 2017-07-20 MED ORDER — NA SULFATE-K SULFATE-MG SULF 17.5-3.13-1.6 GM/177ML PO SOLN: 1.0000 | ORAL | 0 refills | Status: DC

## 2017-07-20 NOTE — Telephone Encounter (Signed)
Pt states she is having custody issues with her children and needs to reschedule her hospital procedure. Colon rescheduled to 10/02/17@11 :30am at [facility]. Pt aware of appt.

## 2017-08-03 ENCOUNTER — Other Ambulatory Visit (INDEPENDENT_AMBULATORY_CARE_PROVIDER_SITE_OTHER): Payer: Medicaid Other

## 2017-08-03 DIAGNOSIS — K5080 Crohn's disease of both small and large intestine without complications: Secondary | ICD-10-CM

## 2017-08-03 DIAGNOSIS — K5090 Crohn's disease, unspecified, without complications: Secondary | ICD-10-CM

## 2017-08-03 LAB — CBC WITH DIFFERENTIAL/PLATELET
BASOS ABS: 0 10*3/uL (ref 0.0–0.1)
Basophils Relative: 0.8 % (ref 0.0–3.0)
EOS ABS: 0.2 10*3/uL (ref 0.0–0.7)
Eosinophils Relative: 3.8 % (ref 0.0–5.0)
HCT: 37.4 % (ref 36.0–46.0)
Hemoglobin: 12.4 g/dL (ref 12.0–15.0)
LYMPHS ABS: 2.7 10*3/uL (ref 0.7–4.0)
Lymphocytes Relative: 46.7 % — ABNORMAL HIGH (ref 12.0–46.0)
MCHC: 33.2 g/dL (ref 30.0–36.0)
MCV: 88.6 fl (ref 78.0–100.0)
Monocytes Absolute: 0.8 10*3/uL (ref 0.1–1.0)
Monocytes Relative: 13 % — ABNORMAL HIGH (ref 3.0–12.0)
NEUTROS ABS: 2.1 10*3/uL (ref 1.4–7.7)
NEUTROS PCT: 35.7 % — AB (ref 43.0–77.0)
PLATELETS: 333 10*3/uL (ref 150.0–400.0)
RBC: 4.22 Mil/uL (ref 3.87–5.11)
RDW: 15 % (ref 11.5–15.5)
WBC: 5.8 10*3/uL (ref 4.0–10.5)

## 2017-08-03 LAB — COMPREHENSIVE METABOLIC PANEL
ALT: 32 U/L (ref 0–35)
AST: 21 U/L (ref 0–37)
Albumin: 4.2 g/dL (ref 3.5–5.2)
Alkaline Phosphatase: 57 U/L (ref 39–117)
BILIRUBIN TOTAL: 0.4 mg/dL (ref 0.2–1.2)
BUN: 6 mg/dL (ref 6–23)
CALCIUM: 9.4 mg/dL (ref 8.4–10.5)
CO2: 29 mEq/L (ref 19–32)
CREATININE: 0.53 mg/dL (ref 0.40–1.20)
Chloride: 100 mEq/L (ref 96–112)
GFR: 171.71 mL/min (ref >60.00)
GLUCOSE: 103 mg/dL — AB (ref 70–99)
Potassium: 3.7 mEq/L (ref 3.5–5.1)
Sodium: 136 mEq/L (ref 135–145)
Total Protein: 7.6 g/dL (ref 6.0–8.3)

## 2017-08-03 LAB — C-REACTIVE PROTEIN: CRP: 1 mg/dL (ref 0.5–20.0)

## 2017-08-05 LAB — QUANTIFERON-TB GOLD PLUS
MITOGEN-NIL: 8.52 [IU]/mL
NIL: 0.03 [IU]/mL
QuantiFERON-TB Gold Plus: NEGATIVE
TB1-NIL: 0.09 IU/mL
TB2-NIL: 0.07 [IU]/mL

## 2017-08-06 NOTE — Progress Notes (Signed)
Information faxed to [facility] for Humira start.

## 2017-08-08 ENCOUNTER — Telehealth: Payer: Self-pay | Admitting: Internal Medicine

## 2017-08-08 ENCOUNTER — Ambulatory Visit: Payer: Medicaid Other | Admitting: Cardiovascular Disease

## 2017-08-08 NOTE — Telephone Encounter (Signed)
Julie Mcconnell from [facility] states that medication humira was sent in but not as a starter dose. Pharmacy needing to know if pt has been on humira before and if not needs to be prescribed starter dose.

## 2017-08-08 NOTE — Telephone Encounter (Signed)
Spoke with Gae Bon at [facility] and explained that Dr Hilarie Fredrickson did not want the starter pack and wanted her to go straight to maintenance as she was transitioning from a different medication.  Gae Bon also requested a copy of recent TB testing and Hepatitis screening.  Both documents were faxed.

## 2017-08-09 ENCOUNTER — Encounter: Payer: Self-pay | Admitting: *Deleted

## 2017-08-09 NOTE — Telephone Encounter (Signed)
Gae Bon calling from [facility] states pt will receive Humira beginning 08/14/17.

## 2017-08-09 NOTE — Telephone Encounter (Signed)
Dr. Hilarie Fredrickson aware.

## 2017-08-17 ENCOUNTER — Telehealth: Payer: Self-pay | Admitting: Internal Medicine

## 2017-08-17 NOTE — Telephone Encounter (Signed)
Patient calling about humira dosage. Julie Mcconnell spoke with nurse. No further note needed.

## 2017-09-17 ENCOUNTER — Ambulatory Visit: Payer: Medicaid Other | Admitting: Cardiovascular Disease

## 2017-09-18 ENCOUNTER — Encounter: Payer: Self-pay | Admitting: *Deleted

## 2017-10-01 NOTE — Progress Notes (Signed)
Spoke to [place] at Dr. Vena Rua office and made her aware of unable to reach patient after calling 4 times. Vaughan Basta verbalized understanding and will let Dr. Hilarie Fredrickson know.

## 2017-10-02 ENCOUNTER — Telehealth: Payer: Self-pay

## 2017-10-02 ENCOUNTER — Ambulatory Visit (HOSPITAL_COMMUNITY): Admission: RE | Admit: 2017-10-02 | Payer: Medicaid Other | Source: Ambulatory Visit | Admitting: Internal Medicine

## 2017-10-02 SURGERY — COLONOSCOPY WITH PROPOFOL
Anesthesia: Monitor Anesthesia Care

## 2017-10-02 NOTE — Progress Notes (Signed)
Attempted to call patient on cell phone listed in chart. No answer. Left voice message to call back regarding colonoscopy today.

## 2017-10-02 NOTE — Telephone Encounter (Signed)
Received call from pre-admission nurse, they have left multiple messages for pt but she has not returned the calls. Not sure if pt will come for procedure today at 11:30am.

## 2017-10-25 ENCOUNTER — Telehealth: Payer: Self-pay | Admitting: Internal Medicine

## 2017-10-25 MED ORDER — ADALIMUMAB 40 MG/0.4ML ~~LOC~~ PSKT: 40.0000 mg | PREFILLED_SYRINGE | SUBCUTANEOUS | 6 refills | Status: DC

## 2017-10-25 NOTE — Telephone Encounter (Signed)
Refill sent to [facility] for pt.

## 2017-10-30 ENCOUNTER — Other Ambulatory Visit (HOSPITAL_BASED_OUTPATIENT_CLINIC_OR_DEPARTMENT_OTHER): Payer: Self-pay

## 2017-10-30 DIAGNOSIS — G4730 Sleep apnea, unspecified: Secondary | ICD-10-CM

## 2017-10-30 DIAGNOSIS — R0683 Snoring: Secondary | ICD-10-CM

## 2017-11-05 ENCOUNTER — Telehealth: Payer: Self-pay | Admitting: Internal Medicine

## 2017-11-05 NOTE — Telephone Encounter (Signed)
If Ms Julie Mcconnell calls please get an update phone number so that [facility] can mail her Humira to her.

## 2017-11-17 ENCOUNTER — Ambulatory Visit (HOSPITAL_BASED_OUTPATIENT_CLINIC_OR_DEPARTMENT_OTHER): Payer: Medicaid Other | Attending: Specialist

## 2018-02-06 ENCOUNTER — Ambulatory Visit: Payer: Medicaid Other | Admitting: Internal Medicine

## 2018-07-01 ENCOUNTER — Other Ambulatory Visit: Payer: Self-pay | Admitting: Internal Medicine

## 2018-07-01 ENCOUNTER — Telehealth: Payer: Self-pay | Admitting: Internal Medicine

## 2018-07-01 MED ORDER — ADALIMUMAB 40 MG/0.4ML ~~LOC~~ PSKT: 40.0000 mg | PREFILLED_SYRINGE | SUBCUTANEOUS | 1 refills | Status: DC

## 2018-07-01 NOTE — Telephone Encounter (Signed)
Pt requested Humira refill: [facility].

## 2018-07-01 NOTE — Telephone Encounter (Signed)
Pt requesting refill of Humira, she has not been seen in a year. Discussed with pt that she needed an OV, pt has already scheduled a visit. Refill sent in for Humira until her next OV.

## 2018-08-08 ENCOUNTER — Other Ambulatory Visit: Payer: Self-pay

## 2018-08-08 ENCOUNTER — Telehealth (INDEPENDENT_AMBULATORY_CARE_PROVIDER_SITE_OTHER): Payer: Medicaid Other | Admitting: Internal Medicine

## 2018-08-08 DIAGNOSIS — K5080 Crohn's disease of both small and large intestine without complications: Secondary | ICD-10-CM | POA: Diagnosis not present

## 2018-08-08 NOTE — Progress Notes (Signed)
This service was provided via telemedicine.   The patient was located at home The provider was located in office  The patient did consent to this telephone visit and is aware of possible charges through their insurance for this visit.   The other persons participating in this telemedicine service were myself, patient, Quintin Alto, CMA  Time spent on call: 15 minutes  Alyscia Carmon is a 33 year old female with a history of ileocolonic Crohn's disease diagnosed in January 2015 seen about a year ago who is seen by tele-visit today for follow-up.  After her last visit she was started on Humira 40 mg every 14 days.  There will have been issues with having the patient follow-up for office visits and procedures.  Colonoscopy which was previously scheduled for May of last year was canceled by the patient and never performed.  She states she has had issues getting medications at times.  Severe abdominal pain.  Pain is left and right abd, middle abdomen.  BMs per day, 2-3 day now, previously more.  Having to strain at times.  Stools are harder than soft.  Blood in the stools and with wiping.  Having some hot flashes, and chills occasionally.  No nausea and vomiting recently.    Humira, took a shot last week.  Know is supposed to take it every other week.  Patient thinks that it was a problem with the Rx.  She feels she missed at least a month without Humira.  Otherwise feels the doses have been on time.  A lot of family issues, still custody issues with kids.    Still smoking, 1 pk/1.5 days.  ETOH denies.    Patient states she has lost about 70 pounds and weight at primary care recently was 272 pounds.  Current Medications, Allergies, Past Medical History, Past Surgical History, Family History and Social History were reviewed in [employer] record.   A/P:  33 year old female with a history of ileocolonic Crohn's disease diagnosed in January 2015 seen about a year ago who is  seen by tele-visit today for follow-up.  1.  Ileocolonic Crohn's disease --difficult to truly know how the course of her Humira administration has gone.  It sounds like it has definitely helped her symptoms and certainly it is indicated for her Crohn's ileocolitis.  I have again expressed the absolute importance for her to maintain office visits, lab work and recommended procedures in order for me to care for her going forward.  She also needs to stop smoking. --She is asked to come for CBC, CMP, CRP and QuantiFERON gold.  Labs must be resulted before we can continue biologic therapy --After reviewing labs Humira 40 mg every 14 days --Surveillance colonoscopy to assess disease activity is recommended once COVID-19 pandemic has improved  2.  Mild constipation --add Colace 100 mg 1 to 2 tablets daily  Office visit recommended in 3-6 months

## 2018-08-08 NOTE — Patient Instructions (Signed)
Your provider has requested that you go to the basement level for lab work. Press "B" on the elevator. The lab is located at the first door on the left as you exit the elevator.  Once we get your labs back, we will be glad to refill your Humira 40 mg every 14 days as long as it remains appropriate for you to do so.  Please purchase the following medications over the counter and take as directed: Colace (stool softener) 100 mg 1-2 tablets daily as needed  You will need colonoscopy in the next 1-2 months to access the severity of your crohns at this time. This will help [name] to determine if we have you on the right dosages of medication etc.

## 2018-09-17 ENCOUNTER — Telehealth: Payer: Self-pay | Admitting: *Deleted

## 2018-09-17 NOTE — Telephone Encounter (Signed)
Left message for patient to call back. She needs to be scheduled for [facility] colonoscopy for surveillance of ileocolonic Crohns disease.  She also still needs to come for labwork as she has not yet come for labs recommended at 08/08/18 visit.

## 2018-09-18 NOTE — Telephone Encounter (Signed)
I have spoken to Julie Mcconnell who has scheduled colonoscopy for crohns surveillance at [facility] on 10/01/18 at 1:30 pm.  She is advised that she will need to have a caregiver 89 or older to bring her, stay for procedure and take her home the day of the test. She is also scheduled for telephone previsit on 09/24/18 at 11 am. I emphasized the importance of her making sure she does answer this phone call as her procedure will be cancelled if she is unable to be reached. Julie Mcconnell verbalizes understanding and has read back dates and times of each appointment to me.

## 2018-09-24 ENCOUNTER — Other Ambulatory Visit: Payer: Self-pay

## 2018-09-24 ENCOUNTER — Ambulatory Visit (AMBULATORY_SURGERY_CENTER): Payer: Self-pay

## 2018-09-24 VITALS — Ht 67.5 in | Wt 272.0 lb

## 2018-09-24 DIAGNOSIS — K5080 Crohn's disease of both small and large intestine without complications: Secondary | ICD-10-CM

## 2018-09-24 MED ORDER — NA SULFATE-K SULFATE-MG SULF 17.5-3.13-1.6 GM/177ML PO SOLN: 1.0000 | Freq: Once | ORAL | 0 refills | Status: AC

## 2018-09-24 NOTE — Progress Notes (Signed)
Denies allergies to eggs or soy products. Denies complication of anesthesia or sedation. Denies use of weight loss medication. Denies use of O2.   Emmi instructions given for colonoscopy.   Pre-Visit was conducted by phone due to Covid 19. Instructions were reviewed with patient and mailed to confirmed home address. Patient was encouraged to call with any questions or concerns regarding instructions.

## 2018-09-29 ENCOUNTER — Telehealth: Payer: Self-pay | Admitting: Nurse Practitioner

## 2018-09-29 ENCOUNTER — Telehealth: Payer: Self-pay | Admitting: *Deleted

## 2018-09-29 NOTE — Telephone Encounter (Signed)
LMOM that we will call back to go over screening questions tomorrow

## 2018-09-29 NOTE — Telephone Encounter (Signed)
Patient called answering service today. I returned her call at 1:45pm. She stated she was scheduled to have a colonoscopy with Dr. Hilarie Fredrickson on Tues. 5/19. She thought she had to be on clear liquids for 4 days prior to her procedure? She was not sure how to take the [place]. She complains of having frequent constipation. I advised she start her clear liquid diet for dinner today, continue her clear liquid diet tomorrow and I will have Dr.Pyrtle's RN call her tomorrow 5/18 to verify her colonoscopy and Suprep instructions provided by Dr. Hilarie Fredrickson. She may require Dulcolax tomorrow in addition to [place].

## 2018-09-30 ENCOUNTER — Telehealth: Payer: Self-pay | Admitting: *Deleted

## 2018-09-30 NOTE — Telephone Encounter (Signed)
Covid-19 travel screening questions  Have you traveled in the last 14 days?no If yes where?  Do you now or have you had a fever in the last 14 days?no  Do you have any respiratory symptoms of shortness of breath or cough now or in the last 14 days? Nothing unusual, pt has a smokers cough and allergies  Do you have a medical history of Congestive Heart Failure?  Do you have a medical history of lung disease?  Do you have any family members or close contacts with diagnosed or suspected Covid-19?no  Pt made aware of care partner policy and will bring a mask with her. SM

## 2018-10-01 ENCOUNTER — Telehealth: Payer: Self-pay | Admitting: Internal Medicine

## 2018-10-01 ENCOUNTER — Encounter: Payer: Medicaid Other | Admitting: Internal Medicine

## 2018-10-01 NOTE — Telephone Encounter (Signed)
Pt is scheduled for a colonoscopy today at 1:30 PM Dr. Hilarie Fredrickson.  Pt reported that she is still constantly running to the bathroom and that she had a sandwich this morning.

## 2018-10-01 NOTE — Telephone Encounter (Signed)
Spoke with Dr Hilarie Fredrickson.  Offered to let patient come on in skipping second dose of prep.  Patient was reluctant and asked if she could come tomorrow.  Rescheduled for 3 pm tomorrow, arrive at 2 PM.  Continue with clears today appt, take second dose of Suprep at 10 AM tomorrow with water, then NPO.  Patient repeated instructions and confirmed she would be here at 2 pm tomorrow.

## 2018-10-02 ENCOUNTER — Other Ambulatory Visit: Payer: Self-pay

## 2018-10-02 ENCOUNTER — Ambulatory Visit (AMBULATORY_SURGERY_CENTER): Payer: Medicaid Other | Admitting: Internal Medicine

## 2018-10-02 ENCOUNTER — Other Ambulatory Visit (INDEPENDENT_AMBULATORY_CARE_PROVIDER_SITE_OTHER): Payer: Medicaid Other

## 2018-10-02 ENCOUNTER — Encounter: Payer: Self-pay | Admitting: Internal Medicine

## 2018-10-02 DIAGNOSIS — K5010 Crohn's disease of large intestine without complications: Secondary | ICD-10-CM

## 2018-10-02 DIAGNOSIS — K5080 Crohn's disease of both small and large intestine without complications: Secondary | ICD-10-CM

## 2018-10-02 LAB — CBC WITH DIFFERENTIAL/PLATELET
Basophils Absolute: 0 10*3/uL (ref 0.0–0.1)
Basophils Relative: 0.5 % (ref 0.0–3.0)
Eosinophils Absolute: 0.2 10*3/uL (ref 0.0–0.7)
Eosinophils Relative: 2.4 % (ref 0.0–5.0)
HCT: 33.1 % — ABNORMAL LOW (ref 36.0–46.0)
Hemoglobin: 10.9 g/dL — ABNORMAL LOW (ref 12.0–15.0)
Lymphocytes Relative: 40.1 % (ref 12.0–46.0)
Lymphs Abs: 2.5 10*3/uL (ref 0.7–4.0)
MCHC: 32.8 g/dL (ref 30.0–36.0)
MCV: 80.4 fl (ref 78.0–100.0)
Monocytes Absolute: 0.8 10*3/uL (ref 0.1–1.0)
Monocytes Relative: 12.1 % — ABNORMAL HIGH (ref 3.0–12.0)
Neutro Abs: 2.8 10*3/uL (ref 1.4–7.7)
Neutrophils Relative %: 44.9 % (ref 43.0–77.0)
Platelets: 438 10*3/uL — ABNORMAL HIGH (ref 150.0–400.0)
RBC: 4.11 Mil/uL (ref 3.87–5.11)
RDW: 16.5 % — ABNORMAL HIGH (ref 11.5–15.5)
WBC: 6.3 10*3/uL (ref 4.0–10.5)

## 2018-10-02 LAB — COMPREHENSIVE METABOLIC PANEL
ALT: 12 U/L (ref 0–35)
AST: 10 U/L (ref 0–37)
Albumin: 4.1 g/dL (ref 3.5–5.2)
Alkaline Phosphatase: 62 U/L (ref 39–117)
BUN: 5 mg/dL — ABNORMAL LOW (ref 6–23)
CO2: 29 mEq/L (ref 19–32)
Calcium: 9.4 mg/dL (ref 8.4–10.5)
Chloride: 97 mEq/L (ref 96–112)
Creatinine, Ser: 0.52 mg/dL (ref 0.40–1.20)
GFR: 163.96 mL/min (ref >60.00)
Glucose, Bld: 99 mg/dL (ref 70–99)
Potassium: 4.1 mEq/L (ref 3.5–5.1)
Sodium: 136 mEq/L (ref 135–145)
Total Bilirubin: 0.3 mg/dL (ref 0.2–1.2)
Total Protein: 8 g/dL (ref 6.0–8.3)

## 2018-10-02 LAB — HIGH SENSITIVITY CRP: CRP, High Sensitivity: 33.39 mg/L — ABNORMAL HIGH (ref 0.000–5.000)

## 2018-10-02 MED ORDER — SODIUM CHLORIDE 0.9 % IV SOLN: 500.0000 mL | Freq: Once | INTRAVENOUS | Status: AC

## 2018-10-02 NOTE — Op Note (Signed)
[place] Patient Name: Julie Mcconnell Procedure Date: 10/02/2018 3:43 PM MRN: [id number] Endoscopist: Jerene Bears , MD Age: 33 Referring MD:  Date of Birth: 02/06/1986 Gender: Female Account #: [id number] Procedure:                Colonoscopy Indications:              Disease activity assessment of Crohn's disease of                            the small bowel and colon Medicines:                Monitored Anesthesia Care Procedure:                Pre-Anesthesia Assessment:                           - Prior to the procedure, a History and Physical                            was performed, and patient medications and                            allergies were reviewed. The patient's tolerance of                            previous anesthesia was also reviewed. The risks                            and benefits of the procedure and the sedation                            options and risks were discussed with the patient.                            All questions were answered, and informed consent                            was obtained. Prior Anticoagulants: The patient has                            taken no previous anticoagulant or antiplatelet                            agents. ASA Grade Assessment: II - A patient with                            mild systemic disease. After reviewing the risks                            and benefits, the patient was deemed in                            satisfactory condition to undergo the procedure.  After obtaining informed consent, the colonoscope                            was passed under direct vision. Throughout the                            procedure, the patient's blood pressure, pulse, and                            oxygen saturations were monitored continuously. The                            Colonoscope was introduced through the anus and                            advanced to the cecum, identified by  appendiceal                            orifice and ileocecal valve. The colonoscopy was                            performed without difficulty. The patient tolerated                            the procedure well. The quality of the bowel                            preparation was poor. The ileocecal valve,                            appendiceal orifice, and rectum were photographed. Scope In: 3:54:17 PM Scope Out: 4:06:36 PM Scope Withdrawal Time: 0 hours 7 minutes 11 seconds  Total Procedure Duration: 0 hours 12 minutes 19 seconds  Findings:                 The digital rectal exam was normal.                           A large amount of semi-solid solid stool was found                            in the right colon, making visualization difficult.                            Scattered small amounts of stool in the left colon,                            but views in the left colon better.                           Mild inflammation characterized by erythema and                            loss of vascularity was found in the  proximal                            transverse colon, in the ascending colon and in the                            cecum. Biopsies were taken with a cold forceps for                            histology.                           Normal mucosa was found in the rectum, in the                            sigmoid colon, in the descending colon and in the                            distal transverse colon. Biopsies were taken with a                            cold forceps for histology.                           The retroflexed view of the distal rectum and anal                            verge was normal and showed no anal or rectal                            abnormalities. Complications:            No immediate complications. Estimated Blood Loss:     Estimated blood loss was minimal. Impression:               - Preparation of the colon was poor in the right                             colon.                           - Mild inflammation was found in the proximal                            transverse colon, in the ascending colon and in the                            cecum secondary to Crohn's disease with colonic                            involvement. Biopsied.                           - Normal mucosa in the rectum, in the sigmoid  colon, in the descending colon and in the distal                            transverse colon. Biopsied.                           - The distal rectum and anal verge are normal on                            retroflexion view. Recommendation:           - Patient has a contact number available for                            emergencies. The signs and symptoms of potential                            delayed complications were discussed with the                            patient. Return to normal activities tomorrow.                            Written discharge instructions were provided to the                            patient.                           - Resume previous diet.                           - Continue present medications.                           - Resume Humira 40 mg every 14 days.                           - Await pathology results.                           - Office follow-up in 2-3 months.                           - Repeat colonoscopy is recommended. The                            colonoscopy date will be determined after pathology                            results from today's exam become available for                            review. Jerene Bears, MD 10/02/2018 4:17:11 PM This report has been signed electronically.

## 2018-10-02 NOTE — Progress Notes (Signed)
Called to room to assist during endoscopic procedure.  Patient ID and intended procedure confirmed with present staff. Received instructions for my participation in the procedure from the performing physician.  

## 2018-10-02 NOTE — Progress Notes (Signed)
To pacu vss. Report to [email or web address]

## 2018-10-02 NOTE — Patient Instructions (Signed)
YOU HAD AN ENDOSCOPIC PROCEDURE TODAY AT [facility]:   Refer to the procedure report that was given to you for any specific questions about what was found during the examination.  If the procedure report does not answer your questions, please call your gastroenterologist to clarify.  If you requested that your care partner not be given the details of your procedure findings, then the procedure report has been included in a sealed envelope for you to review at your convenience later.  YOU SHOULD EXPECT: Some feelings of bloating in the abdomen. Passage of more gas than usual.  Walking can help get rid of the air that was put into your GI tract during the procedure and reduce the bloating. If you had a lower endoscopy (such as a colonoscopy or flexible sigmoidoscopy) you may notice spotting of blood in your stool or on the toilet paper. If you underwent a bowel prep for your procedure, you may not have a normal bowel movement for a few days.  Please Note:  You might notice some irritation and congestion in your nose or some drainage.  This is from the oxygen used during your procedure.  There is no need for concern and it should clear up in a day or so.  SYMPTOMS TO REPORT IMMEDIATELY:   Following lower endoscopy (colonoscopy or flexible sigmoidoscopy):  Excessive amounts of blood in the stool  Significant tenderness or worsening of abdominal pains  Swelling of the abdomen that is new, acute  Fever of 100F or higher  For urgent or emergent issues, a gastroenterologist can be reached at any hour by calling [phone].   DIET:  We do recommend a small meal at first, but then you may proceed to your regular diet.  Drink plenty of fluids but you should avoid alcoholic beverages for 24 hours.  ACTIVITY:  You should plan to take it easy for the rest of today and you should NOT DRIVE or use heavy machinery until tomorrow (because of the sedation medicines used during the test).     FOLLOW UP: Our staff will call the number listed on your records 48-72 hours following your procedure to check on you and address any questions or concerns that you may have regarding the information given to you following your procedure. If we do not reach you, we will leave a message.  We will attempt to reach you two times.  During this call, we will ask if you have developed any symptoms of COVID 19. If you develop any symptoms (for example fever, flu-like symptoms, shortness of breath, cough etc.) before then, please call [phone].  If any biopsies were taken you will be contacted by phone or by letter within the next 1-3 weeks.  Please call us at [phone] if you have not heard about the biopsies in 3 weeks.   Await for biopsy results  Office 2-3 months  Resume Humira every 14 days   SIGNATURES/CONFIDENTIALITY: You and/or your care partner have signed paperwork which will be entered into your electronic medical record.  These signatures attest to the fact that that the information above on your After Visit Summary has been reviewed and is understood.  Full responsibility of the confidentiality of this discharge information lies with you and/or your care-partner.

## 2018-10-04 ENCOUNTER — Telehealth: Payer: Self-pay | Admitting: *Deleted

## 2018-10-04 ENCOUNTER — Telehealth: Payer: Self-pay

## 2018-10-04 NOTE — Telephone Encounter (Signed)
Dr. Hilarie Fredrickson,  This pt is asking about her Humira.  Does she need a prescription sent into the pharmacy?  I wasn't sure if you were waiting for pathology results or not.   Cyril Mourning

## 2018-10-04 NOTE — Telephone Encounter (Signed)
  Follow up Call-  Call back number 10/02/2018  Post procedure Call Back phone  # [phone]  Permission to leave phone message Yes  Some recent data might be hidden     Patient questions:  Do you have a fever, pain , or abdominal swelling? No. Pain Score  0 *  Have you tolerated food without any problems? Yes.    Have you been able to return to your normal activities? Yes.    Do you have any questions about your discharge instructions: Diet   No. Medications  No. Follow up visit  No.  Do you have questions or concerns about your Care? No.  Actions: * If pain score is 4 or above: No action needed, pain <4.  1. Have you developed a fever since your procedure? no  2.   Have you had an respiratory symptoms (SOB or cough) since your procedure? no  3.   Have you tested positive for COVID 19 since your procedure no  4.   Have you had any family members/close contacts diagnosed with the COVID 19 since your procedure?  no   If any of these questions are a yes, please inquire if patient has been seen by family doctor and route this note to Joylene John, Therapist, sports.

## 2018-10-04 NOTE — Telephone Encounter (Signed)
Left message on follow up call. 

## 2018-10-05 LAB — QUANTIFERON-TB GOLD PLUS
Mitogen-NIL: 7.56 IU/mL
NIL: 0.03 IU/mL
QuantiFERON-TB Gold Plus: NEGATIVE
TB1-NIL: 0.01 IU/mL
TB2-NIL: 0.02 IU/mL

## 2018-10-08 ENCOUNTER — Encounter: Payer: Self-pay | Admitting: Internal Medicine

## 2018-10-08 ENCOUNTER — Other Ambulatory Visit: Payer: Self-pay

## 2018-10-08 MED ORDER — ADALIMUMAB 40 MG/0.4ML ~~LOC~~ AJKT: 40.0000 mg | AUTO-INJECTOR | SUBCUTANEOUS | 11 refills | Status: DC

## 2018-10-08 NOTE — Telephone Encounter (Signed)
Yes, please restart Humira 40 mg every 14 days

## 2018-10-08 NOTE — Telephone Encounter (Signed)
Prescription has been sent to pharmacy.

## 2018-10-09 ENCOUNTER — Telehealth: Payer: Self-pay | Admitting: Internal Medicine

## 2018-10-09 NOTE — Telephone Encounter (Signed)
[facility] needs last procedure note and if pt is to just resume Humira at previous dose or does pt need starter kit. Procedure note faxed to [facility] at [phone] with requested information.

## 2018-10-09 NOTE — Telephone Encounter (Signed)
Pt would like to speak with you regarding humira. Pls call her.

## 2018-10-10 ENCOUNTER — Telehealth: Payer: Self-pay | Admitting: Internal Medicine

## 2018-10-10 NOTE — Telephone Encounter (Signed)
Pt called stating that she was returning your call. Pls call her again.

## 2018-10-10 NOTE — Telephone Encounter (Signed)
Called pt and left message that this RN has not called her today. Let her know Humira information was faxed to [facility] and that if she needs to speak further she can call our office back.

## 2018-10-13 ENCOUNTER — Telehealth: Payer: Self-pay | Admitting: Nurse Practitioner

## 2018-10-13 NOTE — Telephone Encounter (Signed)
Patient  HX of Crohn's dz, complains of feeling fatigued, food tasted funny, decreased appetite for 3 to 4 days before she took her Humira injection 5/30. She stated she had been off Humira for 3 months.She developed diarrhea described as green mud like stool yesterday x 1 and today x 1 episode, little abd cramping, no severe pain. She stated her taste is back to normal today. She drank 4 to 5 bottles of water, canned ravioli and a sandwich. No N/V or fever. No cough or SOB. I advised she push fluids, brat diet, I will contact  Dr. Hilarie Fredrickson and his RN for phone follow up tomorrow. If diarrhea persists should have stool studies and labs done. She was content with this plan. She will call the answering service if her sx worsen tonight.

## 2018-10-15 ENCOUNTER — Telehealth: Payer: Self-pay

## 2018-10-15 NOTE — Telephone Encounter (Signed)
-----   Message from Noralyn Pick, NP sent at 10/13/2018  2:59 PM EDT ----- Regarding: Phone call on Sunday [place], can you please call Julie Mcconnell on Monday 6/1 and provide Dr. Hilarie Fredrickson with update.She called the answering service today. Patient complains of feeling fatigued, food tasted funny, decreased appetite for 3 to 4 days before she took her Humira injection 5/30. She stated she had been off Humira for 3 months. She developed diarrhea described as green mud like stool yesterday x 1 and today x 1 episode, little abd cramping, no severe pain. She stated her taste is back to normal today. She drank 4 to 5 bottles of water, canned ravioli and a sandwich. No N/V or fever. No cough or SOB. I advised she push fluids, brat diet, I will contact Dr. Hilarie Fredrickson and his RN for phone follow up tomorrow. If diarrhea persists should have stool studies and labs done. She was content with this plan. She will call the answering service if her sx worsen tonight. Thank you. Julie Mcconnell

## 2018-10-15 NOTE — Telephone Encounter (Signed)
Spoke with pt and she states she is feeling much better, she knows to call back if she has any other issues.

## 2018-12-03 ENCOUNTER — Telehealth: Payer: Self-pay | Admitting: Internal Medicine

## 2018-12-03 DIAGNOSIS — K5080 Crohn's disease of both small and large intestine without complications: Secondary | ICD-10-CM

## 2018-12-03 NOTE — Telephone Encounter (Signed)
Would recommend that she continue Humira but come for a Humira antibody level the day before her next injection; check CBC and iron studies on this day as well  Can give Bentyl 20 mg 3 times daily as needed for abdominal cramping pain Would also recommend Uceris 9 mg daily x6 weeks for active colitis; colitis was active at the time of her surveillance colonoscopy 2 months ago  Office follow-up next available

## 2018-12-03 NOTE — Telephone Encounter (Signed)
Pt requested a call back regarding her Crohn's disease.

## 2018-12-03 NOTE — Telephone Encounter (Signed)
Pt calling stating that she is in terrible pain in her abdominal area. Pt states this is not new, it is a sharp stabbing pain that wakes her up at night. Her stools are formed some days and diarrhea other days and there is BRB present at times. Pt states she is taking her Humira on time. Pt requesting something for the abdominal pain. Please advise.

## 2018-12-04 MED ORDER — DICYCLOMINE HCL 20 MG PO TABS: 20.0000 mg | ORAL_TABLET | Freq: Three times a day (TID) | ORAL | 1 refills | Status: DC

## 2018-12-04 MED ORDER — BUDESONIDE ER 9 MG PO TB24: 9.0000 mg | ORAL_TABLET | Freq: Every day | ORAL | 1 refills | Status: DC

## 2018-12-04 NOTE — Telephone Encounter (Signed)
Spoke with pt and and she is aware. Lab orders in epic, pt will come tomorrow for labs as Humira is due Friday. Scripts sent to pharmacy. Will contact pt for appt when September schedule out.

## 2018-12-16 NOTE — Telephone Encounter (Signed)
On July 24th, I attempted prior authorization of Uceris with Medicaid ([employer]... [id number]). Medicaid mailed a denial for Uceris which was received by [name] today.   "The beneficiary is required to try and fail two preferred agents (when available) from the same class, notwithstanding clinical justification. Based on the information submitted by the provider, the beneficiary does not meed the required clinical criteria."  Patient has previously tried and failed entocort and prednisone. She has also used Cimzia, humira, remicade.  It was also explained in PA request that Uceris is specifically absorbed in the small bowel which is where most of patient's active inflammation is and therefore which medication would be most helpful for her.  Dr Hilarie Fredrickson, please advise.Marland KitchenMarland KitchenMarland Kitchen

## 2018-12-16 NOTE — Telephone Encounter (Signed)
Change to prednisone taper 40 mg daily x 7 days, decrease by 5 mg every 7 days until off Continue biologic

## 2018-12-17 NOTE — Telephone Encounter (Signed)
I have left a voicemail for patient to call back.

## 2018-12-18 NOTE — Telephone Encounter (Signed)
Left message for patient to call back  

## 2018-12-19 MED ORDER — PREDNISONE 10 MG PO TABS: ORAL_TABLET | ORAL | 0 refills | Status: DC

## 2018-12-19 NOTE — Telephone Encounter (Signed)
I have spoken to patient to advise that insurance has denied prior authorization of Uceris. Instead, we will have her to continue Humira and take prednisone taper. She verbalizes understanding.

## 2018-12-19 NOTE — Addendum Note (Signed)
Addended by: Larina Bras on: 12/19/2018 05:48 PM   Modules accepted: Orders

## 2019-02-13 ENCOUNTER — Ambulatory Visit: Payer: Medicaid Other | Admitting: Internal Medicine

## 2019-11-04 ENCOUNTER — Other Ambulatory Visit: Payer: Self-pay

## 2019-11-04 ENCOUNTER — Emergency Department (HOSPITAL_COMMUNITY)
Admission: EM | Admit: 2019-11-04 | Discharge: 2019-11-05 | Disposition: A | Discharge status: Home / Self Care | Payer: Medicaid Other | Attending: Emergency Medicine | Admitting: Emergency Medicine

## 2019-11-04 ENCOUNTER — Encounter (HOSPITAL_COMMUNITY): Payer: Self-pay

## 2019-11-04 DIAGNOSIS — K0889 Other specified disorders of teeth and supporting structures: Secondary | ICD-10-CM | POA: Insufficient documentation

## 2019-11-04 DIAGNOSIS — F17210 Nicotine dependence, cigarettes, uncomplicated: Secondary | ICD-10-CM | POA: Diagnosis not present

## 2019-11-04 DIAGNOSIS — M542 Cervicalgia: Secondary | ICD-10-CM | POA: Insufficient documentation

## 2019-11-04 NOTE — ED Triage Notes (Signed)
Pt has multiple complaints, states that she has been having neck pain for the past two days, thinks she slept on it wrong, causing pain down her arm. Pt also having R upper dental pain and a headache from it. Denies fevers

## 2019-11-05 MED ORDER — CYCLOBENZAPRINE HCL 10 MG PO TABS: 10.0000 mg | ORAL_TABLET | Freq: Two times a day (BID) | ORAL | 0 refills | Status: DC | PRN

## 2019-11-05 MED ORDER — AMOXICILLIN-POT CLAVULANATE 875-125 MG PO TABS: 1.0000 | ORAL_TABLET | Freq: Two times a day (BID) | ORAL | 0 refills | Status: DC

## 2019-11-05 MED ORDER — CYCLOBENZAPRINE HCL 10 MG PO TABS
10.0000 mg | ORAL_TABLET | Freq: Once | ORAL | Status: AC
  Administered 2019-11-05: 10 mg via ORAL
  Filled 2019-11-05: qty 1

## 2019-11-05 MED ORDER — NAPROXEN 500 MG PO TABS: 500.0000 mg | ORAL_TABLET | Freq: Two times a day (BID) | ORAL | 0 refills | Status: AC

## 2019-11-05 MED ORDER — OXYCODONE-ACETAMINOPHEN 5-325 MG PO TABS
2.0000 | ORAL_TABLET | Freq: Once | ORAL | Status: AC
  Administered 2019-11-05: 2 via ORAL
  Filled 2019-11-05: qty 2

## 2019-11-05 MED ORDER — LIDOCAINE 5 % EX PTCH
1.0000 | MEDICATED_PATCH | CUTANEOUS | Status: DC
  Administered 2019-11-05: 1 via TRANSDERMAL
  Filled 2019-11-05: qty 1

## 2019-11-05 NOTE — Discharge Instructions (Addendum)
You have been seen today for neck pain and dental pain. Please read and follow all provided instructions. Return to the emergency room for worsening condition or new concerning symptoms.    1. Medications:  Prescription sent to your pharmacy for Flexeril.  This is a muscle relaxer.  This medication can make you drowsy so do not drive or work when taking it. -Prescription also sent for naproxen.  This is an anti-inflammatory.  Please take as prescribed.  Take it with food so it does not cause an upset stomach.  Do not take any additional Aleve, ibuprofen, Motrin, Goody powders as these medicines are all similar. -Prescription also sent to the pharmacy for Augmentin.  This is an antibiotic used to treat dental infections.  Take as prescribed.  Continue usual home medications Take medications as prescribed. Please review all of the medicines and only take them if you do not have an allergy to them.   2. Treatment: rest, drink plenty of fluids -You can try using a heat pack for your neck pain  3. Follow Up:  Please follow up with primary care provider by scheduling an appointment as soon as possible for a visit  If you do not have a primary care physician, contact [facility] at [phone] for referral   It is also a possibility that you have an allergic reaction to any of the medicines that you have been prescribed - Everybody reacts differently to medications and while MOST people have no trouble with most medicines, you may have a reaction such as nausea, vomiting, rash, swelling, shortness of breath. If this is the case, please stop taking the medicine immediately and contact your physician.  ?

## 2019-11-05 NOTE — ED Provider Notes (Signed)
[facility] EMERGENCY DEPARTMENT Provider Note   CSN: [id number] Arrival date & time: 11/04/19  2322     History Chief Complaint  Patient presents with  . Neck Pain    Julie Mcconnell is a 34 y.o. female with past medical history significant for anemia, anxiety, bipolar disorder, GERD, and Crohn's on Humira.  HPI Patient presents to emergency department today with chief complaint of progressively worsening neck pain x 2 day.  She states she first noticed the pain when she woke up from sleeping.  She thinks she might have slept wrong.  She states the pain is located on the left side of her neck and radiates down her left arm.  She states pain is worse with movement.  She rates the pain 10 of 10 in severity.  She has not taken any medications for symptoms prior to arrival.  She is also concerned she might have a dental infection.  She states she has had a broken left upper molar for several months now. She denies any fall, injury or trauma.  Also denies any fever, chills, facial swelling, headache, visual changes, swelling, numbness, weakness, tingling, decrease sensation.    Past Medical History:  Diagnosis Date  . Allergy   . Anemia   . Anxiety   . Bipolar disorder ([place])   . Crohn's colitis ([place])   . Depression   . GERD (gastroesophageal reflux disease)    occasional w/pregnancy, diet controlled, no meds  . History of chronic bronchitis 2012   last episode 3-4 yrs ago   . History of concussion    child--  no residual  . SVD (spontaneous vaginal delivery)    x 3    Patient Active Problem List   Diagnosis Date Noted  . S/P cesarean section 11/05/2014  . 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 Condyloma acuminatum in female 07/22/2013  . Maternal anemia complicating pregnancy, childbirth, or the puerperium 02/02/2011  . Normal delivery 02/01/2011  . Active labor 01/31/2011  . History of VBAC 01/31/2011    Past Surgical History:  Procedure Laterality  Date  . CESAREAN SECTION  01-26-2005  . CESAREAN SECTION WITH BILATERAL TUBAL LIGATION Bilateral 11/05/2014   Procedure: CESAREAN SECTION WITH BILATERAL TUBAL LIGATION;  Surgeon: Shelly Bombard, MD;  Location: [facility];  Service: Obstetrics;  Laterality: Bilateral;  . INNER EAR SURGERY Right as child  . LASER ABLATION CONDOLAMATA N/A 08/25/2013   Procedure: LASER ABLATION CONDOLAMATA/ANAL EXAM UNDER ANESTHESIA;  Surgeon: Leighton Ruff, MD;  Location: [facility];  Service: General;  Laterality: N/A;  . TONSILLECTOMY  as child  . TUBAL LIGATION  11/05/14  . tubes in ear     as child  . UMBILICAL HERNIA REPAIR  age 46  . WISDOM TOOTH EXTRACTION       OB History    Gravida  5   Para  5   Term  5   Preterm  0   AB  0   Living  4     SAB  0   TAB  0   Ectopic  0   Multiple  0   Live Births  4           Family History  Problem Relation Age of Onset  . Hypertension Mother   . Heart disease Maternal Grandmother   . Colon cancer Neg Hx   . Esophageal cancer Neg Hx   . Rectal cancer Neg Hx   . Stomach cancer  Neg Hx     Social History   Tobacco Use  . Smoking status: Current Every Day Smoker    Packs/day: 1.00    Years: 10.00    Pack years: 10.00    Types: Cigarettes  . Smokeless tobacco: Never Used  Vaping Use  . Vaping Use: Never used  Substance Use Topics  . Alcohol use: Yes    Alcohol/week: 1.0 standard drink    Types: 1 Cans of beer per week    Comment: From time to time even during pregnancy; patient states she last had a drink 06-02-14  . Drug use: No    Home Medications Prior to Admission medications   Medication Sig Start Date End Date Taking? Authorizing Provider  Adalimumab (HUMIRA PEN) 40 MG/0.4ML PNKT Inject 40 mg into the skin every 14 (fourteen) days. 10/08/18   Pyrtle, Lajuan Lines, MD  Adalimumab (HUMIRA) 40 MG/0.4ML PSKT Inject 40 mg into the skin every 14 (fourteen) days. Patient not taking: Reported on 09/24/2018 07/01/18    Jerene Bears, MD  amoxicillin-clavulanate (AUGMENTIN) 875-125 MG tablet Take 1 tablet by mouth every 12 (twelve) hours. 11/05/19   Berenise Hunton E, PA-C  cyclobenzaprine (FLEXERIL) 10 MG tablet Take 1 tablet (10 mg total) by mouth 2 (two) times daily as needed for muscle spasms. 11/05/19   Devarion Mcclanahan E, PA-C  dicyclomine (BENTYL) 20 MG tablet Take 1 tablet (20 mg total) by mouth 3 (three) times daily before meals. 12/04/18   Pyrtle, Lajuan Lines, MD  naproxen (NAPROSYN) 500 MG tablet Take 1 tablet (500 mg total) by mouth 2 (two) times daily for 7 days. 11/05/19 11/12/19  Zeppelin Commisso E, PA-C  predniSONE (DELTASONE) 10 MG tablet Take 4 tabs po qd x 7 days, 3.5 tabs po qd x 7 days, 3 tabs po qd x 7 days, 2.5 tabs po qd x 7 days, 2 tabs po qd x 7 days, 1.5 tabs po qd x 7 days, 1 tab po qd x 7 days, 0.5 tab po qd x 7 days then d/c 12/19/18   Pyrtle, Lajuan Lines, MD    Allergies    Cimzia [certolizumab pegol] and Remicade [infliximab]  Review of Systems   Review of Systems All other systems are reviewed and are negative for acute change except as noted in the HPI.  Physical Exam Updated Vital Signs BP (!) 132/102 (BP Location: Left Arm)   Pulse 86   Temp 98.5 F (36.9 C) (Oral)   Resp 16   SpO2 98%   Physical Exam Vitals and nursing note reviewed.  Constitutional:      Appearance: She is well-developed. She is not ill-appearing or toxic-appearing.  HENT:     Head: Normocephalic and atraumatic.     Nose: Nose normal.     Mouth/Throat:      Comments: Patient has significant poor dentition left upper molar that is fractured with tenderness to surrounding gingiva.No abscess palpated, no trismus. No external facial swelling. Submandibular region soft.  She is tolerating secretions well. No voice change. No tongue swelling or soft palate induration.   Eyes:     General: No scleral icterus.       Right eye: No discharge.        Left eye: No discharge.     Conjunctiva/sclera:  Conjunctivae normal.  Neck:     Vascular: No JVD.     Trachea: Trachea and phonation normal.      Comments: Full ROM intact without spinous process TTP. No  bony stepoffs or deformities, left paraspinous muscle TTP, no muscle spasms. Pain when rotating to the left. No rigidity or meningeal signs. No bruising, erythema, or swelling.  Cardiovascular:     Rate and Rhythm: Normal rate and regular rhythm.     Pulses: Normal pulses.     Heart sounds: Normal heart sounds.  Pulmonary:     Effort: Pulmonary effort is normal.     Breath sounds: Normal breath sounds.  Abdominal:     General: There is no distension.  Musculoskeletal:        General: Normal range of motion.     Cervical back: Normal range of motion. No crepitus. Pain with movement present.     Comments: Full passive ROM of left shoulder. Full ROM of left elbow and wrist. No swelling noted to left upper extremity. Compartments are soft.     Lymphadenopathy:     Cervical: No cervical adenopathy.  Skin:    General: Skin is warm and dry.  Neurological:     Mental Status: She is oriented to person, place, and time.     GCS: GCS eye subscore is 4. GCS verbal subscore is 5. GCS motor subscore is 6.     Comments: Speech is clear and goal oriented, follows commands CN III-XII intact, no facial droop Normal strength in upper and lower extremities bilaterally including dorsiflexion and plantar flexion, strong and equal grip strength Sensation normal to light and sharp touch Moves extremities without ataxia, coordination intact Normal finger to nose and rapid alternating movements Normal gait and balance   Psychiatric:        Behavior: Behavior normal.     ED Results / Procedures / Treatments   Labs (all labs ordered are listed, but only abnormal results are displayed) Labs Reviewed - No data to display  EKG None  Radiology No results found.  Procedures Procedures (including critical care time)  Medications Ordered in  ED Medications  oxyCODONE-acetaminophen (PERCOCET/ROXICET) 5-325 MG per tablet 2 tablet (has no administration in time range)  cyclobenzaprine (FLEXERIL) tablet 10 mg (has no administration in time range)  lidocaine (LIDODERM) 5 % 1 patch (has no administration in time range)    ED Course  I have reviewed the triage vital signs and the nursing notes.  Pertinent labs & imaging results that were available during my care of the patient were reviewed by me and considered in my medical decision making (see chart for details).    MDM Rules/Calculators/A&P                          History provided by patient with additional history obtained from chart review.     Patient seen and examined. Patient presents awake, alert, hemodynamically stable, afebrile, non toxic.  Patient is frustrated by prolonged wait time in the lobby of approximately 13 hours.  On my exam she is overall well-appearing.   Dental exam unconcerning for Ludwig's angina or spread of infection.She has no facial swelling.  She has no midline tenderness to cervical spine, does have tenderness palpation of left cervical paraspinal muscles.  She does have full range of motion of neck.  She has full passive range of motion of left shoulder, elbow and wrist.  She is neurovascularly intact distally.  Compartments are soft.  Strong radial pulses 2+ bilaterally.  Neuro exam is normal. Engaged in shared decision-making with patient regarding imaging and labs as part of work up here.  She would  rather try symptom management as she is tired and wants to go home. I have reviewed the PDMP during this encounter.  She has no recent narcotic prescriptions.  Will give dose of Percocet here in the emergency department.  Will send prescriptions to her pharmacy for antibiotic for possible dental infection, Flexeril, naproxen.  She was advised not to drive while taking muscle relaxer. The patient appears reasonably screened and/or stabilized for discharge  and I doubt any other medical condition or other [facility] requiring further screening, evaluation, or treatment in the ED at this time prior to discharge. The patient is safe for discharge with strict return precautions discussed. Recommend pcp follow up if symptoms persist.  Portions of this note were generated with Dragon dictation software. Dictation errors may occur despite best attempts at proofreading.    Final Clinical Impression(s) / ED Diagnoses Final diagnoses:  Neck pain  Pain, dental    Rx / DC Orders ED Discharge Orders         Ordered    cyclobenzaprine (FLEXERIL) 10 MG tablet  2 times daily PRN     Discontinue  Reprint     11/05/19 1225    naproxen (NAPROSYN) 500 MG tablet  2 times daily     Discontinue  Reprint     11/05/19 1225    amoxicillin-clavulanate (AUGMENTIN) 875-125 MG tablet  Every 12 hours     Discontinue  Reprint     11/05/19 Kenedy, [place], PA-C 11/05/19 1243    Pattricia Boss, MD 11/06/19 1338

## 2019-11-14 ENCOUNTER — Telehealth: Payer: Self-pay

## 2019-11-14 ENCOUNTER — Other Ambulatory Visit: Payer: Self-pay

## 2019-11-14 MED ORDER — HUMIRA (2 SYRINGE) 40 MG/0.4ML ~~LOC~~ PSKT: 40.0000 mg | PREFILLED_SYRINGE | SUBCUTANEOUS | 0 refills | Status: DC

## 2019-12-16 ENCOUNTER — Telehealth: Payer: Self-pay

## 2019-12-16 NOTE — Telephone Encounter (Signed)
Refill request from [facility] denied for Humira. Pt has not been seen since 2019 and needs an OV for more refills.

## 2020-01-06 ENCOUNTER — Telehealth: Payer: Self-pay | Admitting: Internal Medicine

## 2020-01-06 MED ORDER — HUMIRA (2 SYRINGE) 40 MG/0.4ML ~~LOC~~ PSKT: 40.0000 mg | PREFILLED_SYRINGE | SUBCUTANEOUS | 0 refills | Status: DC

## 2020-01-06 NOTE — Telephone Encounter (Signed)
Spoke with pt, she needs a refill on her Humira medication, pt is scheduled with Dr Hilarie Fredrickson for 10/19. Pt will be run out by then.

## 2020-01-06 NOTE — Telephone Encounter (Signed)
One refill sent in for pt.

## 2020-01-06 NOTE — Telephone Encounter (Signed)
Patient is calling, requesting refill for HUMIRA. i let her know she needed an appointment for futher refills but she kept telling me how we should have called her instead of just not refilling medication and wouldnt let me make an appointment. asked to speak with you.

## 2020-01-06 NOTE — Telephone Encounter (Signed)
Spoke with pt and let her know the denial was sent to the pharmacy due to pt not being seen in quite a long time. Pt has appt scheduled for October and will keep that appt.

## 2020-01-28 ENCOUNTER — Telehealth: Payer: Self-pay | Admitting: Internal Medicine

## 2020-01-28 MED ORDER — HUMIRA (2 SYRINGE) 40 MG/0.4ML ~~LOC~~ PSKT: 40.0000 mg | PREFILLED_SYRINGE | SUBCUTANEOUS | 0 refills | Status: DC

## 2020-01-28 NOTE — Telephone Encounter (Signed)
Pt has OV scheduled, Humira refilled X1

## 2020-03-01 ENCOUNTER — Other Ambulatory Visit: Payer: Self-pay

## 2020-03-01 MED ORDER — HUMIRA (2 SYRINGE) 40 MG/0.4ML ~~LOC~~ PSKT: 40.0000 mg | PREFILLED_SYRINGE | SUBCUTANEOUS | 0 refills | Status: DC

## 2020-03-02 ENCOUNTER — Ambulatory Visit: Payer: Medicaid Other | Admitting: Internal Medicine

## 2020-03-09 NOTE — Progress Notes (Deleted)
     03/09/2020 Julie Mcconnell [id number] 1985/10/19   Chief Complaint:  History of Present Illness: Julie Mcconnell is a 34 year old female with a history of ileocolonic Crohn's disease diagnosed in January 2015 followed by Dr. Hilarie Fredrickson.    Colonoscopy 5/202/2020: - Preparation of the colon was poor in the right colon. - Mild inflammation was found in the proximal transverse colon, in the ascending colon and in the cecum secondary to Crohn's disease with colonic involvement. Biopsied. - Normal mucosa in the rectum, in the sigmoid colon, in the descending colon and in the distal transverse colon. Biopsied. - The distal rectum and anal verge are normal on retroflexion view. - Resume Humira 38m  Q 2 weeks  -Follow up in office in 3 months.  - Repeat colonoscopy in 5 years. Biopsy results: 1. Surgical [P], right colon biopsies - MILDLY ACTIVE CHRONIC COLITIS, CONSISTENT WITH PATIENT'S CLINICAL HISTORY OF CROHN'S DISEASE - GRANULOMAS ARE NOT IDENTIFIED - NEGATIVE FOR DYSPLASIA 2. Surgical [P], left colon biopsies - INACTIVE CHRONIC COLITIS, CONSISTENT WITH PATIENT'S CLINICAL HISTORY OF CROHN'S DISEASE - GRANULOMAS ARE NOT IDENTIFIED - NEGATIVE FOR DYSPLASIA    Current Medications, Allergies, Past Medical History, Past Surgical History, Family History and Social History were reviewed in [employer].   Review of Systems:   Constitutional: Negative for fever, sweats, chills or weight loss.  Respiratory: Negative for shortness of breath.   Cardiovascular: Negative for chest pain, palpitations and leg swelling.  Gastrointestinal: See HPI.  Musculoskeletal: Negative for back pain or muscle aches.  Neurological: Negative for dizziness, headaches or paresthesias.    Physical Exam: There were no vitals taken for this visit. General: Well developed, w   ***female in no acute distress. Head: Normocephalic and atraumatic. Eyes: No scleral icterus.  Conjunctiva pink . Ears: Normal auditory acuity. Mouth: Dentition intact. No ulcers or lesions.  Lungs: Clear throughout to auscultation. Heart: Regular rate and rhythm, no murmur. Abdomen: Soft, nontender and nondistended. No masses or hepatomegaly. Normal bowel sounds x 4 quadrants.  Rectal: *** Musculoskeletal: Symmetrical with no gross deformities. Extremities: No edema. Neurological: Alert oriented x 4. No focal deficits.  Psychological: Alert and cooperative. Normal mood and affect  Assessment and Recommendations:  158 34year old female with Ileocolonic Crohn's disease

## 2020-03-10 ENCOUNTER — Ambulatory Visit: Payer: Medicaid Other | Admitting: Nurse Practitioner

## 2020-03-12 ENCOUNTER — Telehealth: Payer: Self-pay | Admitting: Internal Medicine

## 2020-03-12 NOTE — Telephone Encounter (Signed)
Pt called to let us know that she has gotten her insurance changed to [employer]. She wanted to let us know that she did show up for her appt and found out that we did not take her previous plan, to change and reschedule her appt. Pt plans to keep her appt 04/01/20. She wanted our office to know so we would not deny her Humira refill.

## 2020-03-26 ENCOUNTER — Other Ambulatory Visit: Payer: Self-pay

## 2020-03-26 MED ORDER — HUMIRA (2 SYRINGE) 40 MG/0.4ML ~~LOC~~ PSKT: 40.0000 mg | PREFILLED_SYRINGE | SUBCUTANEOUS | 0 refills | Status: DC

## 2020-04-01 ENCOUNTER — Other Ambulatory Visit (INDEPENDENT_AMBULATORY_CARE_PROVIDER_SITE_OTHER): Payer: Medicaid Other

## 2020-04-01 ENCOUNTER — Encounter: Payer: Self-pay | Admitting: Nurse Practitioner

## 2020-04-01 ENCOUNTER — Ambulatory Visit (INDEPENDENT_AMBULATORY_CARE_PROVIDER_SITE_OTHER): Payer: Medicaid Other | Admitting: Nurse Practitioner

## 2020-04-01 VITALS — BP 126/80 | HR 81 | Ht 65.0 in | Wt 279.5 lb

## 2020-04-01 DIAGNOSIS — K5080 Crohn's disease of both small and large intestine without complications: Secondary | ICD-10-CM | POA: Diagnosis not present

## 2020-04-01 DIAGNOSIS — D649 Anemia, unspecified: Secondary | ICD-10-CM

## 2020-04-01 LAB — COMPREHENSIVE METABOLIC PANEL
ALT: 10 U/L (ref 0–35)
AST: 13 U/L (ref 0–37)
Albumin: 4 g/dL (ref 3.5–5.2)
Alkaline Phosphatase: 83 U/L (ref 39–117)
BUN: 6 mg/dL (ref 6–23)
CO2: 32 mEq/L (ref 19–32)
Calcium: 8.8 mg/dL (ref 8.4–10.5)
Chloride: 102 mEq/L (ref 96–112)
Creatinine, Ser: 0.52 mg/dL (ref 0.40–1.20)
GFR: 120.85 mL/min (ref >60.00)
Glucose, Bld: 99 mg/dL (ref 70–99)
Potassium: 3.5 mEq/L (ref 3.5–5.1)
Sodium: 138 mEq/L (ref 135–145)
Total Bilirubin: 0.3 mg/dL (ref 0.2–1.2)
Total Protein: 7.4 g/dL (ref 6.0–8.3)

## 2020-04-01 LAB — CBC WITH DIFFERENTIAL/PLATELET
Basophils Absolute: 0.1 10*3/uL (ref 0.0–0.1)
Basophils Relative: 1.1 % (ref 0.0–3.0)
Eosinophils Absolute: 0.3 10*3/uL (ref 0.0–0.7)
Eosinophils Relative: 5.8 % — ABNORMAL HIGH (ref 0.0–5.0)
HCT: 32.9 % — ABNORMAL LOW (ref 36.0–46.0)
Hemoglobin: 10.6 g/dL — ABNORMAL LOW (ref 12.0–15.0)
Lymphocytes Relative: 48 % — ABNORMAL HIGH (ref 12.0–46.0)
Lymphs Abs: 2.2 10*3/uL (ref 0.7–4.0)
MCHC: 32.3 g/dL (ref 30.0–36.0)
MCV: 81 fl (ref 78.0–100.0)
Monocytes Absolute: 0.5 10*3/uL (ref 0.1–1.0)
Monocytes Relative: 12.2 % — ABNORMAL HIGH (ref 3.0–12.0)
Neutro Abs: 1.5 10*3/uL (ref 1.4–7.7)
Neutrophils Relative %: 32.9 % — ABNORMAL LOW (ref 43.0–77.0)
Platelets: 298 10*3/uL (ref 150.0–400.0)
RBC: 4.06 Mil/uL (ref 3.87–5.11)
RDW: 17.1 % — ABNORMAL HIGH (ref 11.5–15.5)
WBC: 4.5 10*3/uL (ref 4.0–10.5)

## 2020-04-01 LAB — C-REACTIVE PROTEIN: CRP: <1 mg/dL (ref 0.5–20.0)

## 2020-04-01 LAB — FERRITIN: Ferritin: 5.8 ng/mL — ABNORMAL LOW (ref 10.0–291.0)

## 2020-04-01 LAB — IRON: Iron: 27 ug/dL — ABNORMAL LOW (ref 42–145)

## 2020-04-01 MED ORDER — HUMIRA (2 SYRINGE) 40 MG/0.4ML ~~LOC~~ PSKT: 40.0000 mg | PREFILLED_SYRINGE | SUBCUTANEOUS | 6 refills | Status: DC

## 2020-04-01 NOTE — Progress Notes (Signed)
04/01/2020 Julie Mcconnell [id number] 03/26/1986   Chief Complaint: Crohn's follow-up  History of Present Illness:  34 year-old female with a history of ileocolonic Crohn's disease diagnosed in January 2015.  Initially on Remicade in 2015 (discontinued after her 3rd induction dose due to an infusion reaction) then switched to Cimzia 04/2014 then Humira 06/2017.  Previously noncompliant with her office follow-up due to family stressors. She underwent a telehealth visit with Dr. Hilarie Fredrickson on 08/08/2018 during the Covid pandemic.  At that time, she was having left and right mid abdominal pain and passing 2-3 bowel movements daily.  Humira 40 mg SQ every 14 days was continued.  A colonoscopy was completed 09/30/2018 which resulted in a poor prep in the right colon, mildly active chronic colitis was found in the proximal transverse colon, a sending and cecum.  The rectum, sigmoid and descending colon and distal transverse colon appeared normal and biopsies of the left colon showed inactive chronic colitis.  No dysplasia.  She remains on Humira every 2 weeks.  Her last Humira  Injection was on 03/24/2020.  She reports having generalized abdominal pain prior to defecation which resolves after she passes a bowel movement.  She has similar abdominal pain which is relieved after she passes gas per the rectum.  No current abdominal pain. She has intermittent constipation with straining.  She was taking a stool softener daily but recently ran out.  She intends to just more of the stool softener which results in passing an easier soft formed brown bowel movement.  She stated her Crohn's disease is well controlled at this time.  She infrequently sees a spot of red blood on the stool which occurs when she strains. She received 1 Moderna Covid vaccination. She denies having any recent infections.  She avoids NSAIDs.  She continues to smoke cigarettes.  No other complaints at this time.  Labs 10/02/2018: Sodium 136.  BUN 5.   Creatinine 0.52.  Alk phos 62.  Albumin 4.1.  AST 10.  ALT 12.  Total bili 0.3.  WBC 6.3.  Hemoglobin 10.9.  Hematocrit 33.1.  MCV 80.4.  Platelet 438.  Quantiferon gold negative. Labs 07/22/2013 Hepatitis B surface antigen negative.  Hepatitis B core total nonreactive.  Hepatitis B surface antibody positive.  Colonoscopy 10/02/2018: - Preparation of the colon was poor in the right colon. A large amount of semi-solid solid stool was found in the right colon, making visualization difficult. Scattered small amounts of stool in the left colon, but views in the left colon better. - Mild inflammation was found in the proximal transverse colon, in the ascending colon and in the cecum secondary to Crohn's disease with colonic involvement. Biopsied. - Normal mucosa in the rectum, in the sigmoid colon, in the descending colon and in the distal transverse colon. Biopsied. - The distal rectum and anal verge are normal on retroflexion view. Biopsies: 1. Surgical [P], right colon biopsies - MILDLY ACTIVE CHRONIC COLITIS, CONSISTENT WITH PATIENT'S CLINICAL HISTORY OF CROHN'S DISEASE - GRANULOMAS ARE NOT IDENTIFIED - NEGATIVE FOR DYSPLASIA 2. Surgical [P], left colon biopsies - INACTIVE CHRONIC COLITIS, CONSISTENT WITH PATIENT'S CLINICAL HISTORY OF CROHN'S DISEASE - GRANULOMAS ARE NOT IDENTIFIED - NEGATIVE FOR DYSPLASIA    Current Medications, Allergies, Past Medical History, Past Surgical History, Family History and Social History were reviewed in [employer] record.   Review of Systems:   Constitutional: Negative for fever, sweats, chills or weight loss.  Respiratory: Negative for shortness of breath.  Cardiovascular: Negative for chest pain, palpitations and leg swelling.  Gastrointestinal: See HPI.  Musculoskeletal: Negative for back pain or muscle aches.  Neurological: Negative for dizziness, headaches or paresthesias.    Physical Exam: BP 126/80 (BP Location: Left  Arm, Patient Position: Sitting, Cuff Size: Large)   Pulse 81   Ht _0  (1.651 m)   Wt 279 lb 8 oz (126.8 kg)   BMI 46.51 kg/m   Wt Readings from Last 3 Encounters:  04/01/20 279 lb 8 oz (126.8 kg)  10/02/18 272 lb (123.4 kg)  09/24/18 272 lb (123.4 kg)   General: Obese 34 year old female in no acute distress. Head: Normocephalic and atraumatic. Eyes: No scleral icterus. Conjunctiva pink . Ears: Normal auditory acuity. Mouth: No ulcers or lesions.  Lungs: Clear throughout to auscultation. Heart: Regular rate and rhythm, no murmur. Abdomen: Soft, nontender and nondistended. No masses or hepatomegaly. Normal bowel sounds x 4 quadrants.  Rectal: Deferred. Musculoskeletal: Symmetrical with no gross deformities. Extremities: No edema. Neurological: Alert oriented x 4. No focal deficits.  Psychological: Alert and cooperative. Normal mood and affect  Assessment and Recommendations:  23.  34 year old female with Crohn's ileocolitis on Humira 40 mg subcu every 2 weeks -Continue Humira 40 mg subcu every 2 weeks -CBC, CMP, CRP -COVID-19 and flu vaccinations recommended -Softener as needed, avoid straining -Check Adalimumab drug and antibody levels if she has worsening constipation or abdominal pain.  -Dr. Hilarie Fredrickson to verify colonoscopy recall date, poor prep at the time of her colonoscopy 10/02/2018 -Follow-up in our office in 6 months and as needed  2.  Anemia, secondary to #1 -CBC, iron, iron saturation, TIBC and ferritin level.  3.  Chronic smoker -Smoking cessation recommended

## 2020-04-01 NOTE — Patient Instructions (Addendum)
If you are age 34 or older, your body mass index should be between 23-30. Your Body mass index is 46.51 kg/m. If this is out of the aforementioned range listed, please consider follow up with your Primary Care Provider.  If you are age 63 or younger, your body mass index should be between 19-25. Your Body mass index is 46.51 kg/m. If this is out of the aformentioned range listed, please consider follow up with your Primary Care Provider.   Your provider has requested that you go to the basement level for lab work before leaving today. Press "B" on the elevator. The lab is located at the first door on the left as you exit the elevator.  Continue Humira.  Avoid spicy/fatty foods.  Recommend getting flu shot & COVID -19 vaccination.   Follow up in 6 months or sooner if needed.

## 2020-04-02 LAB — IRON, TOTAL/TOTAL IRON BINDING CAP
%SAT: 5 % (calc) — ABNORMAL LOW (ref 16–45)
Iron: 23 ug/dL — ABNORMAL LOW (ref 40–190)
TIBC: 444 mcg/dL (calc) (ref 250–450)

## 2020-04-02 NOTE — Progress Notes (Signed)
Addendum: Reviewed and agree with assessment and management plan. Roe Wilner M, MD  

## 2020-04-14 ENCOUNTER — Telehealth: Payer: Self-pay

## 2020-04-14 ENCOUNTER — Other Ambulatory Visit: Payer: Self-pay

## 2020-04-14 DIAGNOSIS — K5080 Crohn's disease of both small and large intestine without complications: Secondary | ICD-10-CM

## 2020-04-14 DIAGNOSIS — D508 Other iron deficiency anemias: Secondary | ICD-10-CM

## 2020-04-14 NOTE — Telephone Encounter (Signed)
[employer] "[employer]" approved Humira 40 mg/0.4 ml syringe from 04/03/2020 unitl 04/03/2021 (J code [id number])

## 2020-04-15 ENCOUNTER — Telehealth: Payer: Self-pay | Admitting: Hematology and Oncology

## 2020-04-15 NOTE — Telephone Encounter (Signed)
Rceived a new hem erferral from Dr. Hilarie Fredrickson for IDA. Ms. Ransome has been cld and scheduled to see Dr. Lorenso Courier on 12/17 at 1pm. Pt aware to arrive 30 minutes early.

## 2020-04-27 ENCOUNTER — Ambulatory Visit: Payer: Medicaid Other

## 2020-04-28 ENCOUNTER — Telehealth: Payer: Self-pay | Admitting: Internal Medicine

## 2020-04-28 NOTE — Telephone Encounter (Signed)
Pt wanted to know why she is going to see a doctor at the cancer center. Explained to pt that her iron is low and her hgb is low. The blood specialist at the cancer center can treat anemia and give IV iron. Pt aware.

## 2020-04-28 NOTE — Telephone Encounter (Signed)
Pt is requesting a call back from a nurse in regards to Crohns disease, pt did not disclose any further information

## 2020-04-30 ENCOUNTER — Inpatient Hospital Stay: Payer: Medicaid Other

## 2020-04-30 ENCOUNTER — Other Ambulatory Visit: Payer: Self-pay

## 2020-04-30 ENCOUNTER — Inpatient Hospital Stay: Payer: Medicaid Other | Attending: Hematology and Oncology | Admitting: Hematology and Oncology

## 2020-04-30 ENCOUNTER — Encounter: Payer: Self-pay | Admitting: Hematology and Oncology

## 2020-04-30 VITALS — BP 113/112 | HR 108 | Temp 98.7°F | Resp 19 | Ht 65.0 in | Wt 251.3 lb

## 2020-04-30 DIAGNOSIS — K50911 Crohn's disease, unspecified, with rectal bleeding: Secondary | ICD-10-CM | POA: Diagnosis not present

## 2020-04-30 DIAGNOSIS — F17210 Nicotine dependence, cigarettes, uncomplicated: Secondary | ICD-10-CM | POA: Diagnosis not present

## 2020-04-30 DIAGNOSIS — D500 Iron deficiency anemia secondary to blood loss (chronic): Secondary | ICD-10-CM | POA: Insufficient documentation

## 2020-04-30 DIAGNOSIS — R5383 Other fatigue: Secondary | ICD-10-CM | POA: Diagnosis not present

## 2020-04-30 DIAGNOSIS — N920 Excessive and frequent menstruation with regular cycle: Secondary | ICD-10-CM | POA: Insufficient documentation

## 2020-04-30 DIAGNOSIS — R0602 Shortness of breath: Secondary | ICD-10-CM | POA: Diagnosis not present

## 2020-04-30 LAB — CMP (CANCER CENTER ONLY)
ALT: 14 U/L (ref 0–44)
AST: 14 U/L — ABNORMAL LOW (ref 15–41)
Albumin: 4.3 g/dL (ref 3.5–5.0)
Alkaline Phosphatase: 77 U/L (ref 38–126)
Anion gap: 13 (ref 5–15)
BUN: 7 mg/dL (ref 6–20)
CO2: 23 mmol/L (ref 22–32)
Calcium: 9.7 mg/dL (ref 8.9–10.3)
Chloride: 102 mmol/L (ref 98–111)
Creatinine: 0.79 mg/dL (ref 0.44–1.00)
GFR, Estimated: >60 mL/min (ref >60)
Glucose, Bld: 109 mg/dL — ABNORMAL HIGH (ref 70–99)
Potassium: 3.4 mmol/L — ABNORMAL LOW (ref 3.5–5.1)
Sodium: 138 mmol/L (ref 135–145)
Total Bilirubin: 0.8 mg/dL (ref 0.3–1.2)
Total Protein: 8.9 g/dL — ABNORMAL HIGH (ref 6.5–8.1)

## 2020-04-30 LAB — CBC WITH DIFFERENTIAL (CANCER CENTER ONLY)
Abs Immature Granulocytes: 0.03 10*3/uL (ref 0.00–0.07)
Basophils Absolute: 0 10*3/uL (ref 0.0–0.1)
Basophils Relative: 0 %
Eosinophils Absolute: 0.1 10*3/uL (ref 0.0–0.5)
Eosinophils Relative: 1 %
HCT: 37.7 % (ref 36.0–46.0)
Hemoglobin: 12.1 g/dL (ref 12.0–15.0)
Immature Granulocytes: 0 %
Lymphocytes Relative: 28 %
Lymphs Abs: 2.8 10*3/uL (ref 0.7–4.0)
MCH: 26.2 pg (ref 26.0–34.0)
MCHC: 32.1 g/dL (ref 30.0–36.0)
MCV: 81.8 fL (ref 80.0–100.0)
Monocytes Absolute: 1.2 10*3/uL — ABNORMAL HIGH (ref 0.1–1.0)
Monocytes Relative: 12 %
Neutro Abs: 6 10*3/uL (ref 1.7–7.7)
Neutrophils Relative %: 59 %
Platelet Count: 434 10*3/uL — ABNORMAL HIGH (ref 150–400)
RBC: 4.61 MIL/uL (ref 3.87–5.11)
RDW: 16.1 % — ABNORMAL HIGH (ref 11.5–15.5)
WBC Count: 10.2 10*3/uL (ref 4.0–10.5)
nRBC: 0 % (ref 0.0–0.2)

## 2020-04-30 LAB — RETIC PANEL
Immature Retic Fract: 12.5 % (ref 2.3–15.9)
RBC.: 4.59 MIL/uL (ref 3.87–5.11)
Retic Count, Absolute: 61 10*3/uL (ref 19.0–186.0)
Retic Ct Pct: 1.3 % (ref 0.4–3.1)
Reticulocyte Hemoglobin: 32.6 pg (ref >27.9)

## 2020-04-30 NOTE — Progress Notes (Signed)
[place] [phone]   Fax:([phone] [facility] NOTE  Patient Care Team: Care, Jinny Blossom [facility] as PCP - General (Family Medicine)  Hematological/Oncological History # Iron Deficiency Anemia in Setting of Crohn's Disease 03/22/2020: iron 23, TIBC 444, Sat 5%, Ferritin 5.8. WBC 4.5, Hgb 10.6, HCT 81.0, Plt 298 04/30/2020: establish care with Dr. Lorenso Courier   CHIEF COMPLAINTS/PURPOSE OF CONSULTATION:  "Iron Deficiency Anemia"  HISTORY OF PRESENTING ILLNESS:  Julie Mcconnell 34 y.o. female with medical history significant for Crohn's disease, GERD, depression/anxiety, and chronic bronchitis who presents for evaluation of iron deficiency anemia.  On review of the previous records Julie Mcconnell has a longstanding history of iron deficiency dating back to at least 2016.  Most recently on 04/01/2020 the patient was found to have an iron level of 23, total iron capacity of 444, T sat of 5%, and a ferritin of 5.8.  At that time her hemoglobin to be 10.6.  Due to concern for her history of Crohn's disease and this new iron deficiency anemia she was referred to hematology for further evaluation and management.  On exam today Julie Mcconnell is accompanied by her husband. She reports she has never required iron therapy, received IV iron, or received blood transfusions.  She notes that her Crohn's disease is currently under good control with Humira and prednisone therapy.  She notes that she does occasionally see a little bit of blood in her stool, but very rarely has large volume blood loss.  In terms of her menstrual cycle she notes that she does have heavy periods that typically last about 7 days, however they are irregular.  She notes that on her heaviest days she typically bleeds through 4-5 pads a day.  She notes that she does follow with [facility] in [facility].  On further discussion she notes that her family history is remarkable for hypertension  in her mother and a recent breast cancer in her cousin.  She reports that she is a active smoker smoking 10 to 15 cigarettes/day but does not use any alcohol.  She is not currently employed and is on disability.  She notes that she has some fatigue and shortness of breath and that she sleeps a lot and when she wakes up she still feels tired.  She notes that she does not feel like doing anything and that she finds it difficult to push herself.  She notes that she did not have any ice cravings, but she does however crave ice cream.  She denies any fevers, chills, sweats, nausea, vomiting or diarrhea at this time.  A full 10 point ROS is listed below.  MEDICAL HISTORY:  Past Medical History:  Diagnosis Date  . Allergy   . Anemia   . Anxiety   . Bipolar disorder ([place])   . Crohn's colitis ([place])   . Depression   . GERD (gastroesophageal reflux disease)    occasional w/pregnancy, diet controlled, no meds  . History of chronic bronchitis 2012   last episode 3-4 yrs ago   . History of concussion    child--  no residual  . SVD (spontaneous vaginal delivery)    x 3    SURGICAL HISTORY: Past Surgical History:  Procedure Laterality Date  . CESAREAN SECTION  01-26-2005  . CESAREAN SECTION WITH BILATERAL TUBAL LIGATION Bilateral 11/05/2014   Procedure: CESAREAN SECTION WITH BILATERAL TUBAL LIGATION;  Surgeon: Shelly Bombard, MD;  Location: [facility];  Service: Obstetrics;  Laterality: Bilateral;  . INNER EAR SURGERY Right as child  . LASER ABLATION CONDOLAMATA N/A 08/25/2013   Procedure: LASER ABLATION CONDOLAMATA/ANAL EXAM UNDER ANESTHESIA;  Surgeon: Leighton Ruff, MD;  Location: [facility];  Service: General;  Laterality: N/A;  . TONSILLECTOMY  as child  . TUBAL LIGATION  11/05/14  . tubes in ear     as child  . UMBILICAL HERNIA REPAIR  age 14  . WISDOM TOOTH EXTRACTION      SOCIAL HISTORY: Social History   Socioeconomic History  . Marital status: Single    Spouse name: Not  on file  . Number of children: Not on file  . Years of education: Not on file  . Highest education level: Not on file  Occupational History  . Not on file  Tobacco Use  . Smoking status: Current Every Day Smoker    Packs/day: 1.00    Years: 10.00    Pack years: 10.00    Types: Cigarettes  . Smokeless tobacco: Never Used  Vaping Use  . Vaping Use: Never used  Substance and Sexual Activity  . Alcohol use: Yes    Alcohol/week: 1.0 standard drink    Types: 1 Cans of beer per week    Comment: From time to time even during pregnancy; patient states she last had a drink 06-02-14  . Drug use: No  . Sexual activity: Yes    Birth control/protection: None    Comment: pregnant  Other Topics Concern  . Not on file  Social History Narrative  . Not on file   Social Determinants of Health   Financial Resource Strain: Not on file  Food Insecurity: Not on file  Transportation Needs: Not on file  Physical Activity: Not on file  Stress: Not on file  Social Connections: Not on file  Intimate Partner Violence: Not on file    FAMILY HISTORY: Family History  Problem Relation Age of Onset  . Hypertension Mother   . Heart disease Maternal Grandmother   . Colon cancer Neg Hx   . Esophageal cancer Neg Hx   . Rectal cancer Neg Hx   . Stomach cancer Neg Hx     ALLERGIES:  is allergic to cimzia [certolizumab pegol] and remicade [infliximab].  MEDICATIONS:  Current Outpatient Medications  Medication Sig Dispense Refill  . Adalimumab (HUMIRA) 40 MG/0.4ML PSKT Inject 40 mg into the skin every 14 (fourteen) days. 2 each 6   Current Facility-Administered Medications  Medication Dose Route Frequency Provider Last Rate Last Admin  . 0.9 %  sodium chloride infusion  500 mL Intravenous Once Pyrtle, Lajuan Lines, MD        REVIEW OF SYSTEMS:   Constitutional: ( - ) fevers, ( - )  chills , ( - ) night sweats Eyes: ( - ) blurriness of vision, ( - ) double vision, ( - ) watery eyes Ears, nose, mouth,  throat, and face: ( - ) mucositis, ( - ) sore throat Respiratory: ( - ) cough, ( - ) dyspnea, ( - ) wheezes Cardiovascular: ( - ) palpitation, ( - ) chest discomfort, ( - ) lower extremity swelling Gastrointestinal:  ( - ) nausea, ( - ) heartburn, ( - ) change in bowel habits Skin: ( - ) abnormal skin rashes Lymphatics: ( - ) new lymphadenopathy, ( - ) easy bruising Neurological: ( - ) numbness, ( - ) tingling, ( - ) new weaknesses Behavioral/Psych: ( - ) mood change, ( - ) new changes  All other systems were reviewed with the patient and are negative.  PHYSICAL EXAMINATION:  Vitals:   04/30/20 1306  BP: (!) 113/112  Pulse: (!) 108  Resp: 19  Temp: 98.7 F (37.1 C)  SpO2: 100%   Filed Weights   04/30/20 1306  Weight: 251 lb 4.8 oz (114 kg)    GENERAL: well appearing middle aged [place] American female in NAD  SKIN: skin color, texture, turgor are normal, no rashes or significant lesions EYES: conjunctiva are pink and non-injected, sclera clear LUNGS: clear to auscultation and percussion with normal breathing effort HEART: regular rate & rhythm and no murmurs and no lower extremity edema Musculoskeletal: no cyanosis of digits and no clubbing  PSYCH: alert & oriented x 3, fluent speech NEURO: no focal motor/sensory deficits  LABORATORY DATA:  I have reviewed the data as listed CBC Latest Ref Rng & Units 04/01/2020 10/02/2018 08/03/2017  WBC 4.0 - 10.5 K/uL 4.5 6.3 5.8  Hemoglobin 12.0 - 15.0 g/dL 10.6(L) 10.9(L) 12.4  Hematocrit 36.0 - 46.0 % 32.9(L) 33.1(L) 37.4  Platelets 150.0 - 400.0 K/uL 298.0 438.0(H) 333.0    CMP Latest Ref Rng & Units 04/01/2020 10/02/2018 08/03/2017  Glucose 70 - 99 mg/dL 99 99 103(H)  BUN 6 - 23 mg/dL 6 5(L) 6  Creatinine 0.40 - 1.20 mg/dL 0.52 0.52 0.53  Sodium 135 - 145 mEq/L 138 136 136  Potassium 3.5 - 5.1 mEq/L 3.5 4.1 3.7  Chloride 96 - 112 mEq/L 102 97 100  CO2 19 - 32 mEq/L 32 29 29  Calcium 8.4 - 10.5 mg/dL 8.8 9.4 9.4  Total Protein  6.0 - 8.3 g/dL 7.4 8.0 7.6  Total Bilirubin 0.2 - 1.2 mg/dL 0.3 0.3 0.4  Alkaline Phos 39 - 117 U/L 83 62 57  AST 0 - 37 U/L 13 10 21   ALT 0 - 35 U/L 10 12 32    RADIOGRAPHIC STUDIES: No results found.  ASSESSMENT & PLAN Julie Mcconnell 34 y.o. female with medical history significant for Crohn's disease, GERD, depression/anxiety, and chronic bronchitis who presents for evaluation of iron deficiency anemia.  After review the labs, the records, schedule the patient the findings most consistent with iron deficiency anemia secondary to Crohn's disease.  The iron deficiency anemia and Crohn's disease is twofold, primarily due to poor iron absorption in the setting of inflammation as well as blood loss through the mucosa.  Fortunately it appears the patient is not losing much blood through her GI tract, however she is young menstruating female and therefore is not likely replenishing her iron stores nutritionally.  Given this I do believe that she is an excellent candidate for IV iron therapy.  At this time I would recommend IV Feraheme 5 to 10 mg q. 7 days x 2 doses in order to replete her iron stores.  The patient is symptomatic at this time I do believe would benefit from this therapy overall.  We will plan to see her back in approximate 4- 6 weeks after her last dose of IV iron.  # Iron Deficiency Anemia in Setting of Crohn's Disease --in the setting of Crohn's disease I would not recommend attempting PO iron as this would be poorly absorbed and may flare symptoms --recommend IV feraheme 5109m q 7 days x 2 doses if iron therapy is required.  --continued Crohn's management per GI --today will order repeat iron panel, CBC, and CMP --have patient RTC in approximately 4-6 weeks after last dose of IV iron.  Orders Placed This Encounter  Procedures  . CBC with Differential (Cancer Center Only)    Standing Status:   Future    Number of Occurrences:   1    Standing Expiration Date:   04/30/2021  .  Retic Panel    Standing Status:   Future    Number of Occurrences:   1    Standing Expiration Date:   04/30/2021  . CMP ([facility] only)    Standing Status:   Future    Number of Occurrences:   1    Standing Expiration Date:   04/30/2021  . Iron and TIBC    Standing Status:   Future    Number of Occurrences:   1    Standing Expiration Date:   04/30/2021  . Ferritin    Standing Status:   Future    Number of Occurrences:   1    Standing Expiration Date:   04/30/2021    All questions were answered. The patient knows to call the clinic with any problems, questions or concerns.  A total of more than 60 minutes were spent on this encounter and over half of that time was spent on counseling and coordination of care as outlined above.   Ledell Peoples, MD Department of Hematology/Oncology [place] at [facility] Phone: [phone] Pager: [phone] Email: [email or web address]  04/30/2020 2:02 PM

## 2020-05-01 ENCOUNTER — Other Ambulatory Visit: Payer: Self-pay

## 2020-05-01 ENCOUNTER — Emergency Department (HOSPITAL_COMMUNITY)
Admission: EM | Admit: 2020-05-01 | Discharge: 2020-05-01 | Disposition: A | Discharge status: Home / Self Care | Payer: Medicaid Other | Attending: Emergency Medicine | Admitting: Emergency Medicine

## 2020-05-01 ENCOUNTER — Encounter (HOSPITAL_COMMUNITY): Payer: Self-pay | Admitting: Emergency Medicine

## 2020-05-01 DIAGNOSIS — N939 Abnormal uterine and vaginal bleeding, unspecified: Secondary | ICD-10-CM | POA: Diagnosis not present

## 2020-05-01 DIAGNOSIS — Z5321 Procedure and treatment not carried out due to patient leaving prior to being seen by health care provider: Secondary | ICD-10-CM | POA: Insufficient documentation

## 2020-05-01 DIAGNOSIS — E611 Iron deficiency: Secondary | ICD-10-CM | POA: Diagnosis not present

## 2020-05-01 DIAGNOSIS — R5383 Other fatigue: Secondary | ICD-10-CM | POA: Insufficient documentation

## 2020-05-01 LAB — BASIC METABOLIC PANEL
Anion gap: 15 (ref 5–15)
BUN: 9 mg/dL (ref 6–20)
CO2: 20 mmol/L — ABNORMAL LOW (ref 22–32)
Calcium: 9.3 mg/dL (ref 8.9–10.3)
Chloride: 100 mmol/L (ref 98–111)
Creatinine, Ser: 0.8 mg/dL (ref 0.44–1.00)
GFR, Estimated: >60 mL/min (ref >60)
Glucose, Bld: 108 mg/dL — ABNORMAL HIGH (ref 70–99)
Potassium: 3.3 mmol/L — ABNORMAL LOW (ref 3.5–5.1)
Sodium: 135 mmol/L (ref 135–145)

## 2020-05-01 LAB — CBC WITH DIFFERENTIAL/PLATELET
Abs Immature Granulocytes: 0.05 10*3/uL (ref 0.00–0.07)
Basophils Absolute: 0.1 10*3/uL (ref 0.0–0.1)
Basophils Relative: 1 %
Eosinophils Absolute: 0.1 10*3/uL (ref 0.0–0.5)
Eosinophils Relative: 1 %
HCT: 38.3 % (ref 36.0–46.0)
Hemoglobin: 11.9 g/dL — ABNORMAL LOW (ref 12.0–15.0)
Immature Granulocytes: 0 %
Lymphocytes Relative: 30 %
Lymphs Abs: 3.7 10*3/uL (ref 0.7–4.0)
MCH: 25.7 pg — ABNORMAL LOW (ref 26.0–34.0)
MCHC: 31.1 g/dL (ref 30.0–36.0)
MCV: 82.7 fL (ref 80.0–100.0)
Monocytes Absolute: 1.3 10*3/uL — ABNORMAL HIGH (ref 0.1–1.0)
Monocytes Relative: 10 %
Neutro Abs: 7.1 10*3/uL (ref 1.7–7.7)
Neutrophils Relative %: 58 %
Platelets: 440 10*3/uL — ABNORMAL HIGH (ref 150–400)
RBC: 4.63 MIL/uL (ref 3.87–5.11)
RDW: 16.2 % — ABNORMAL HIGH (ref 11.5–15.5)
WBC: 12.2 10*3/uL — ABNORMAL HIGH (ref 4.0–10.5)
nRBC: 0 % (ref 0.0–0.2)

## 2020-05-01 NOTE — ED Triage Notes (Addendum)
Pt presents to ED BIB [facility]. Pt c/o vaginal bleeding and iron deficiency. Per pt she had iron level drawn today and it was 5.  EMS VSS

## 2020-05-01 NOTE — ED Notes (Signed)
Pt called for vitals no answer.

## 2020-05-03 ENCOUNTER — Telehealth: Payer: Self-pay | Admitting: Internal Medicine

## 2020-05-03 LAB — IRON AND TIBC
Iron: 69 ug/dL (ref 41–142)
Saturation Ratios: 15 % — ABNORMAL LOW (ref 21–57)
TIBC: 449 ug/dL — ABNORMAL HIGH (ref 236–444)
UIBC: 380 ug/dL (ref 120–384)

## 2020-05-03 LAB — FERRITIN: Ferritin: 14 ng/mL (ref 11–307)

## 2020-05-03 NOTE — Telephone Encounter (Signed)
Pt states she did not get her Iron infusion. States she was told they had to make sure her insurance would cover the infusion. Discussed with pt that they have to do this so she would not get a bill if it was not covered. Pt aware.

## 2020-05-07 ENCOUNTER — Encounter: Payer: Self-pay | Admitting: Hematology and Oncology

## 2020-05-13 ENCOUNTER — Telehealth: Payer: Self-pay | Admitting: Internal Medicine

## 2020-05-13 NOTE — Telephone Encounter (Signed)
The pt states she is due for her humira today but she has a severe cough and head cold and is being tested for COVID and the flu.  I advised her to not take the humira today. She is going to wait for all her testing to return and call out office on Monday for further recommendations. I will pass this on to Dr Hilarie Fredrickson

## 2020-05-13 NOTE — Telephone Encounter (Signed)
Pt is requesting a call back from a nurse, pt states she is experiencing a head cold and she would like to know if she should take her HUMIRA medication

## 2020-05-17 ENCOUNTER — Ambulatory Visit: Payer: Medicaid Other

## 2020-05-17 NOTE — Telephone Encounter (Signed)
Thanks Patty, I agree If COVID neg and symptoms are improved then I would redose Humira If COVID pos then she should wait 5 days and then if symptoms are improved redose at that time Do not take if febrile, otherwise take as soon as possible once she is feeling better Thanks

## 2020-05-17 NOTE — Telephone Encounter (Signed)
Pt called and states that one of her covid tests was positive and one was negative. Pt states she went ahead and took her Humira injection yesterday. Discussed with her she needs to contact her PCP if she does not continue to get better, she states she no longer had a fever. Dr. Hilarie Fredrickson aware.

## 2020-05-21 ENCOUNTER — Telehealth: Payer: Self-pay | Admitting: *Deleted

## 2020-05-21 ENCOUNTER — Inpatient Hospital Stay: Payer: Medicaid Other

## 2020-05-21 NOTE — Telephone Encounter (Signed)
Patient called to cancel iron infusion appointment at 1300 today.  She is currently in quarantine and will call next week to reschedule appointment.  RN notified infusion and Dr. Libby Maw RN.

## 2020-05-28 ENCOUNTER — Inpatient Hospital Stay: Payer: Medicaid Other

## 2020-06-07 ENCOUNTER — Telehealth: Payer: Self-pay | Admitting: Hematology and Oncology

## 2020-06-07 NOTE — Telephone Encounter (Signed)
Left message with rescheduled appointment. Gave option to call back to reschedule if needed.

## 2020-06-16 ENCOUNTER — Telehealth: Payer: Self-pay | Admitting: Hematology and Oncology

## 2020-06-16 NOTE — Telephone Encounter (Signed)
Called pt per 2/2 sch msg - unable to reach pt The patient left the office before the visit was finished - left message for pt to call back to schedule missed appt.

## 2020-07-02 ENCOUNTER — Other Ambulatory Visit: Payer: Medicaid Other

## 2020-07-02 ENCOUNTER — Ambulatory Visit: Payer: Medicaid Other | Admitting: Hematology and Oncology

## 2020-07-08 ENCOUNTER — Other Ambulatory Visit: Payer: Self-pay | Admitting: Hematology and Oncology

## 2020-07-08 DIAGNOSIS — D500 Iron deficiency anemia secondary to blood loss (chronic): Secondary | ICD-10-CM

## 2020-07-09 ENCOUNTER — Inpatient Hospital Stay: Payer: Medicaid Other | Attending: Hematology and Oncology

## 2020-07-09 ENCOUNTER — Inpatient Hospital Stay: Payer: Medicaid Other | Admitting: Hematology and Oncology

## 2020-08-31 ENCOUNTER — Telehealth: Payer: Self-pay | Admitting: *Deleted

## 2020-09-01 ENCOUNTER — Telehealth: Payer: Self-pay | Admitting: Hematology and Oncology

## 2020-09-01 NOTE — Telephone Encounter (Signed)
Scheduled appts per 4/20 sch msg. Pt aware.

## 2020-09-20 ENCOUNTER — Inpatient Hospital Stay: Payer: Medicaid Other | Attending: Hematology and Oncology

## 2020-09-20 ENCOUNTER — Other Ambulatory Visit: Payer: Self-pay

## 2020-09-20 VITALS — BP 123/84 | HR 85 | Temp 98.4°F | Resp 18 | Wt 252.5 lb

## 2020-09-20 DIAGNOSIS — K50911 Crohn's disease, unspecified, with rectal bleeding: Secondary | ICD-10-CM | POA: Insufficient documentation

## 2020-09-20 DIAGNOSIS — D500 Iron deficiency anemia secondary to blood loss (chronic): Secondary | ICD-10-CM | POA: Diagnosis present

## 2020-09-20 MED ORDER — SODIUM CHLORIDE 0.9 % IV SOLN
Freq: Once | INTRAVENOUS | Status: AC
  Filled 2020-09-20: qty 250

## 2020-09-20 MED ORDER — SODIUM CHLORIDE 0.9 % IV SOLN
510.0000 mg | Freq: Once | INTRAVENOUS | Status: AC
  Administered 2020-09-20: 510 mg via INTRAVENOUS
  Filled 2020-09-20: qty 510

## 2020-09-20 NOTE — Progress Notes (Signed)
Patient was observed for 30 minutes post infusion with no complaints. Vitals stable and patient in no distress.

## 2020-09-20 NOTE — Patient Instructions (Signed)
Ferumoxytol injection What is this medicine? FERUMOXYTOL is an iron complex. Iron is used to make healthy red blood cells, which carry oxygen and nutrients throughout the body. This medicine is used to treat iron deficiency anemia. This medicine may be used for other purposes; ask your health care provider or pharmacist if you have questions. COMMON BRAND NAME(S): Feraheme What should I tell my health care provider before I take this medicine? They need to know if you have any of these conditions:  anemia not caused by low iron levels  high levels of iron in the blood  magnetic resonance imaging (MRI) test scheduled  an unusual or allergic reaction to iron, other medicines, foods, dyes, or preservatives  pregnant or trying to get pregnant  breast-feeding How should I use this medicine? This medicine is for injection into a vein. It is given by a health care professional in a hospital or clinic setting. Talk to your pediatrician regarding the use of this medicine in children. Special care may be needed. Overdosage: If you think you have taken too much of this medicine contact a poison control center or emergency room at once. NOTE: This medicine is only for you. Do not share this medicine with others. What if I miss a dose? It is important not to miss your dose. Call your doctor or health care professional if you are unable to keep an appointment. What may interact with this medicine? This medicine may interact with the following medications:  other iron products This list may not describe all possible interactions. Give your health care provider a list of all the medicines, herbs, non-prescription drugs, or dietary supplements you use. Also tell them if you smoke, drink alcohol, or use illegal drugs. Some items may interact with your medicine. What should I watch for while using this medicine? Visit your doctor or healthcare professional regularly. Tell your doctor or healthcare  professional if your symptoms do not start to get better or if they get worse. You may need blood work done while you are taking this medicine. You may need to follow a special diet. Talk to your doctor. Foods that contain iron include: whole grains/cereals, dried fruits, beans, or peas, leafy green vegetables, and organ meats (liver, kidney). What side effects may I notice from receiving this medicine? Side effects that you should report to your doctor or health care professional as soon as possible:  allergic reactions like skin rash, itching or hives, swelling of the face, lips, or tongue  breathing problems  changes in blood pressure  feeling faint or lightheaded, falls  fever or chills  flushing, sweating, or hot feelings  swelling of the ankles or feet Side effects that usually do not require medical attention (report to your doctor or health care professional if they continue or are bothersome):  diarrhea  headache  nausea, vomiting  stomach pain This list may not describe all possible side effects. Call your doctor for medical advice about side effects. You may report side effects to FDA at 1-800-FDA-1088. Where should I keep my medicine? This drug is given in a hospital or clinic and will not be stored at home. NOTE: This sheet is a summary. It may not cover all possible information. If you have questions about this medicine, talk to your doctor, pharmacist, or health care provider.  2021 Elsevier/Gold Standard (2016-06-19 20:21:10)  

## 2020-09-27 ENCOUNTER — Other Ambulatory Visit: Payer: Self-pay

## 2020-09-27 ENCOUNTER — Inpatient Hospital Stay: Payer: Medicaid Other

## 2020-09-27 VITALS — BP 147/85 | HR 88 | Temp 98.0°F | Resp 16

## 2020-09-27 DIAGNOSIS — D500 Iron deficiency anemia secondary to blood loss (chronic): Secondary | ICD-10-CM | POA: Diagnosis not present

## 2020-09-27 MED ORDER — SODIUM CHLORIDE 0.9 % IV SOLN
Freq: Once | INTRAVENOUS | Status: AC
  Filled 2020-09-27: qty 250

## 2020-09-27 MED ORDER — SODIUM CHLORIDE 0.9 % IV SOLN
510.0000 mg | Freq: Once | INTRAVENOUS | Status: AC
  Administered 2020-09-27: 510 mg via INTRAVENOUS
  Filled 2020-09-27: qty 510

## 2020-09-27 NOTE — Patient Instructions (Signed)
Ferumoxytol injection What is this medicine? FERUMOXYTOL is an iron complex. Iron is used to make healthy red blood cells, which carry oxygen and nutrients throughout the body. This medicine is used to treat iron deficiency anemia. This medicine may be used for other purposes; ask your health care provider or pharmacist if you have questions. COMMON BRAND NAME(S): Feraheme What should I tell my health care provider before I take this medicine? They need to know if you have any of these conditions:  anemia not caused by low iron levels  high levels of iron in the blood  magnetic resonance imaging (MRI) test scheduled  an unusual or allergic reaction to iron, other medicines, foods, dyes, or preservatives  pregnant or trying to get pregnant  breast-feeding How should I use this medicine? This medicine is for injection into a vein. It is given by a health care professional in a hospital or clinic setting. Talk to your pediatrician regarding the use of this medicine in children. Special care may be needed. Overdosage: If you think you have taken too much of this medicine contact a poison control center or emergency room at once. NOTE: This medicine is only for you. Do not share this medicine with others. What if I miss a dose? It is important not to miss your dose. Call your doctor or health care professional if you are unable to keep an appointment. What may interact with this medicine? This medicine may interact with the following medications:  other iron products This list may not describe all possible interactions. Give your health care provider a list of all the medicines, herbs, non-prescription drugs, or dietary supplements you use. Also tell them if you smoke, drink alcohol, or use illegal drugs. Some items may interact with your medicine. What should I watch for while using this medicine? Visit your doctor or healthcare professional regularly. Tell your doctor or healthcare  professional if your symptoms do not start to get better or if they get worse. You may need blood work done while you are taking this medicine. You may need to follow a special diet. Talk to your doctor. Foods that contain iron include: whole grains/cereals, dried fruits, beans, or peas, leafy green vegetables, and organ meats (liver, kidney). What side effects may I notice from receiving this medicine? Side effects that you should report to your doctor or health care professional as soon as possible:  allergic reactions like skin rash, itching or hives, swelling of the face, lips, or tongue  breathing problems  changes in blood pressure  feeling faint or lightheaded, falls  fever or chills  flushing, sweating, or hot feelings  swelling of the ankles or feet Side effects that usually do not require medical attention (report to your doctor or health care professional if they continue or are bothersome):  diarrhea  headache  nausea, vomiting  stomach pain This list may not describe all possible side effects. Call your doctor for medical advice about side effects. You may report side effects to FDA at 1-800-FDA-1088. Where should I keep my medicine? This drug is given in a hospital or clinic and will not be stored at home. NOTE: This sheet is a summary. It may not cover all possible information. If you have questions about this medicine, talk to your doctor, pharmacist, or health care provider.  2021 Elsevier/Gold Standard (2016-06-19 20:21:10)  

## 2020-10-28 ENCOUNTER — Other Ambulatory Visit: Payer: Self-pay

## 2020-10-28 MED ORDER — HUMIRA (2 SYRINGE) 40 MG/0.4ML ~~LOC~~ PSKT: 40.0000 mg | PREFILLED_SYRINGE | SUBCUTANEOUS | 0 refills | Status: DC

## 2020-11-07 ENCOUNTER — Other Ambulatory Visit: Payer: Self-pay | Admitting: Hematology and Oncology

## 2020-11-08 ENCOUNTER — Inpatient Hospital Stay: Payer: Medicaid Other | Attending: Hematology and Oncology

## 2020-11-08 ENCOUNTER — Inpatient Hospital Stay: Payer: Medicaid Other | Admitting: Hematology and Oncology

## 2020-11-19 ENCOUNTER — Telehealth: Payer: Self-pay | Admitting: Internal Medicine

## 2020-11-19 ENCOUNTER — Telehealth: Payer: Self-pay | Admitting: Hematology and Oncology

## 2020-11-19 NOTE — Telephone Encounter (Signed)
R/s appts per 7/8 sch msg. Pt aware.

## 2020-11-19 NOTE — Telephone Encounter (Signed)
Pt states she had a message to contact our office last week when her phone was off. She could not call back. Last week so she is calling today. Do you know what she is referring to? I did not call her.

## 2020-11-19 NOTE — Telephone Encounter (Signed)
Spoke with pt and she will try to contact the cancer center.

## 2020-11-19 NOTE — Telephone Encounter (Signed)
I havent called her. It is possible that [facility] contacted her. It appears she no showed for labs and an office visit with Dr Lorenso Courier.

## 2020-11-19 NOTE — Telephone Encounter (Signed)
Inbound call from patient. States she received a notice to contact Dr. Hilarie Fredrickson office but she did not know the reason. Best contact number [phone]

## 2020-12-08 ENCOUNTER — Telehealth: Payer: Self-pay | Admitting: Hematology and Oncology

## 2020-12-08 NOTE — Telephone Encounter (Signed)
R/s 7/28 appt to an earlier time due to provider on call schedule. Called and left msg

## 2020-12-09 ENCOUNTER — Inpatient Hospital Stay: Payer: Medicaid Other | Admitting: Hematology and Oncology

## 2020-12-09 ENCOUNTER — Inpatient Hospital Stay: Payer: Medicaid Other

## 2020-12-14 ENCOUNTER — Telehealth: Payer: Self-pay | Admitting: Nurse Practitioner

## 2020-12-14 NOTE — Telephone Encounter (Signed)
Lauren, please call the patient and schedule her for an office follow-up appointment.  I last saw her in the office 03/2020 for Crohn's disease with recommendation to follow-up in 6 months which was not done.  I received a fax from [facility] to refill her Humira 40 mg prefilled syringe injection. Ok to refill Humira 79m/0.4ML SQ every two weeks. Dispense # 2 prefilled syringes.   I will leave the fax order form in my office if you can stop by to pick up an fax to [facility]. THX

## 2020-12-15 NOTE — Telephone Encounter (Signed)
Prior Auth sent.

## 2021-01-10 ENCOUNTER — Ambulatory Visit: Payer: Medicaid Other | Admitting: Hematology and Oncology

## 2021-01-10 ENCOUNTER — Other Ambulatory Visit: Payer: Medicaid Other

## 2021-02-24 ENCOUNTER — Telehealth: Payer: Self-pay | Admitting: Nurse Practitioner

## 2021-02-24 NOTE — Telephone Encounter (Signed)
Inbound call from patient requesting additional refills for Humira please.

## 2021-02-25 MED ORDER — HUMIRA (2 SYRINGE) 40 MG/0.4ML ~~LOC~~ PSKT: 40.0000 mg | PREFILLED_SYRINGE | SUBCUTANEOUS | 0 refills | Status: DC

## 2021-02-25 NOTE — Telephone Encounter (Signed)
Patient is following up about humira authoization.

## 2021-02-25 NOTE — Telephone Encounter (Signed)
Spoke with patient, she reports that she needs a refill on Humira. Advised that we will send in refill but she is overdue for routine follow up. Patient has been scheduled for a follow up with Carl Best, NP on Thursday, 03/24/21 at 3 PM. Pt verbalized understanding and had no concerns at the end of the call.

## 2021-03-01 ENCOUNTER — Other Ambulatory Visit: Payer: Self-pay

## 2021-03-01 DIAGNOSIS — K5080 Crohn's disease of both small and large intestine without complications: Secondary | ICD-10-CM

## 2021-03-01 NOTE — Telephone Encounter (Signed)
The refill and the appointment has been taken care of by [facility], [place]. I have called the patient. She agrees to call for transportation to have her non-fasting labs drawn 03/21/21.

## 2021-03-01 NOTE — Telephone Encounter (Signed)
Beth, pls contact patient as she was not seen in office for a 6 month follow up as previously requested. Ok to refill her Humira x 2 doses only. She needs office visit and labs for further refills. Pls send her to the lab for a CBC, CMP and CRP. Pls have her complete the labs a few days prior to her office visit. thx

## 2021-03-04 ENCOUNTER — Other Ambulatory Visit: Payer: Self-pay | Admitting: Nurse Practitioner

## 2021-03-04 NOTE — Telephone Encounter (Signed)
RX was previously sent, see phone call

## 2021-03-24 ENCOUNTER — Ambulatory Visit: Payer: Medicaid Other | Admitting: Nurse Practitioner

## 2021-04-13 ENCOUNTER — Encounter: Payer: Self-pay | Admitting: Nurse Practitioner

## 2021-04-13 ENCOUNTER — Other Ambulatory Visit (INDEPENDENT_AMBULATORY_CARE_PROVIDER_SITE_OTHER): Payer: Medicaid Other

## 2021-04-13 ENCOUNTER — Ambulatory Visit (INDEPENDENT_AMBULATORY_CARE_PROVIDER_SITE_OTHER): Payer: Medicaid Other | Admitting: Nurse Practitioner

## 2021-04-13 VITALS — BP 120/78 | HR 77 | Ht 65.5 in | Wt 272.0 lb

## 2021-04-13 DIAGNOSIS — K5080 Crohn's disease of both small and large intestine without complications: Secondary | ICD-10-CM

## 2021-04-13 LAB — COMPREHENSIVE METABOLIC PANEL
ALT: 12 U/L (ref 0–35)
AST: 11 U/L (ref 0–37)
Albumin: 3.9 g/dL (ref 3.5–5.2)
Alkaline Phosphatase: 66 U/L (ref 39–117)
BUN: 9 mg/dL (ref 6–23)
CO2: 28 mEq/L (ref 19–32)
Calcium: 9.1 mg/dL (ref 8.4–10.5)
Chloride: 102 mEq/L (ref 96–112)
Creatinine, Ser: 0.52 mg/dL (ref 0.40–1.20)
GFR: 119.97 mL/min (ref >60.00)
Glucose, Bld: 104 mg/dL — ABNORMAL HIGH (ref 70–99)
Potassium: 3.8 mEq/L (ref 3.5–5.1)
Sodium: 136 mEq/L (ref 135–145)
Total Bilirubin: 0.3 mg/dL (ref 0.2–1.2)
Total Protein: 7.2 g/dL (ref 6.0–8.3)

## 2021-04-13 LAB — CBC WITH DIFFERENTIAL/PLATELET
Basophils Absolute: 0.1 10*3/uL (ref 0.0–0.1)
Basophils Relative: 0.8 % (ref 0.0–3.0)
Eosinophils Absolute: 0.3 10*3/uL (ref 0.0–0.7)
Eosinophils Relative: 4.2 % (ref 0.0–5.0)
HCT: 37.6 % (ref 36.0–46.0)
Hemoglobin: 12.3 g/dL (ref 12.0–15.0)
Lymphocytes Relative: 40 % (ref 12.0–46.0)
Lymphs Abs: 2.9 10*3/uL (ref 0.7–4.0)
MCHC: 32.7 g/dL (ref 30.0–36.0)
MCV: 91.7 fl (ref 78.0–100.0)
Monocytes Absolute: 0.8 10*3/uL (ref 0.1–1.0)
Monocytes Relative: 10.8 % (ref 3.0–12.0)
Neutro Abs: 3.2 10*3/uL (ref 1.4–7.7)
Neutrophils Relative %: 44.2 % (ref 43.0–77.0)
Platelets: 370 10*3/uL (ref 150.0–400.0)
RBC: 4.1 Mil/uL (ref 3.87–5.11)
RDW: 14.4 % (ref 11.5–15.5)
WBC: 7.1 10*3/uL (ref 4.0–10.5)

## 2021-04-13 NOTE — Progress Notes (Signed)
04/13/2021 Julie Mcconnell [id number] 01/26/1986   Chief Complaint: Crohn's follow-up  History of Present Illness:  35 year-old female with a history of ileocolonic Crohn's disease diagnosed in January 2015.  Initially on Remicade in 2015 (discontinued after her 3rd induction dose due to an infusion reaction) then switched to Cimzia 04/2014 then Humira 06/2017.  I last saw Julie Mcconnell in the office 04/01/2020, at that time may she had generalized abdominal pain prior to defecation which resolved after she passed a bowel movement and intermittent constipation with infrequent spot of red blood on the stool.   Laboratory studies 04/01/2020: WBC 4.5.  Hemoglobin 10.6.  MCV 81.  Platelet 298.  Iron 23.  Iron saturation 5%.  Ferritin 5.8.  She was referred to hematology for IV iron infusions.  Her most recent iron infusions were on 09/20/2020 and 09/27/2020.  Patient was due for follow-up with Dr. Lorenso Courier 11/2020.  She has intermittent central abdominal pain which goes away after she passes a BM. She is passing a soft formed stool once every 3 to 4 days. No rectal bleeding or black stools. She had increased abdominal pain with loose stools for 3 to 4 days a few months ago when she had a delay receiving her Humira. Her symptoms improved shortly after she received her Humira injection. Her last dose of Humira was one week ago. She stated her boyfriend thought she had a seizure last night, she developed generalized shakiness during her sleep which he witnessed. No prior history of seizures. She reported having a history of possible sleep apnea, does not use cpap. No NSAID use.   Colonoscopy 10/02/2018: - Preparation of the colon was poor in the right colon. A large amount of semi-solid solid stool was found in the right colon, making visualization difficult. Scattered small amounts of stool in the left colon, but views in the left colon better. - Mild inflammation was found in the proximal transverse colon, in  the ascending colon and in the cecum secondary to Crohn's disease with colonic involvement. Biopsied. - Normal mucosa in the rectum, in the sigmoid colon, in the descending colon and in the distal transverse colon. Biopsied. - The distal rectum and anal verge are normal on retroflexion view. Biopsies: 1. Surgical [P], right colon biopsies - MILDLY ACTIVE CHRONIC COLITIS, CONSISTENT WITH PATIENT'S CLINICAL HISTORY OF CROHN'S DISEASE - GRANULOMAS ARE NOT IDENTIFIED - NEGATIVE FOR DYSPLASIA 2. Surgical [P], left colon biopsies - INACTIVE CHRONIC COLITIS, CONSISTENT WITH PATIENT'S CLINICAL HISTORY OF CROHN'S DISEASE - GRANULOMAS ARE NOT IDENTIFIED - NEGATIVE FOR DYSPLASIA    Current Outpatient Medications on File Prior to Visit  Medication Sig Dispense Refill   Adalimumab (HUMIRA) 40 MG/0.4ML PSKT Inject 40 mg into the skin every 14 (fourteen) days. 2 each 0   Multiple Vitamins-Minerals (CENTRUM PO) Take by mouth. One daily     Current Facility-Administered Medications on File Prior to Visit  Medication Dose Route Frequency Provider Last Rate Last Admin   0.9 %  sodium chloride infusion  500 mL Intravenous Once Pyrtle, Julie Lines, MD       Allergies  Allergen Reactions   Cimzia [Certolizumab Pegol] Other (See Comments)    Burning sensation   Remicade [Infliximab] Other (See Comments)    Chest pain and wheezing   Current Medications, Allergies, Past Medical History, Past Surgical History, Family History and Social History were reviewed in [employer] record.   Review of Systems:   Constitutional: Negative for fever, sweats,  chills or weight loss.  Respiratory: Negative for shortness of breath.   Cardiovascular: Negative for chest pain, palpitations and leg swelling.  Gastrointestinal: See HPI.  Musculoskeletal: Negative for back pain or muscle aches.  Neurological: Negative for dizziness, headaches or paresthesias.    Physical Exam: BP 120/78 Comment: large  cuff  Pulse 77   Ht 5' 5.5" (1.664 m)   Wt 272 lb (123.4 kg)   BMI 44.57 kg/m  General:35 year old obese female in NAD.  Head: Normocephalic and atraumatic. Eyes: No scleral icterus. Conjunctiva pink . Ears: Normal auditory acuity. Mouth: Dentition intact. No ulcers or lesions.  Lungs: Clear throughout to auscultation. Heart: Regular rate and rhythm, no murmur. Abdomen: Soft, nondistended. Mild periumbilical painNo masses or hepatomegaly. Normal bowel sounds x 4 quadrants.  Rectal: Deferred.  Musculoskeletal: Symmetrical with no gross deformities. Extremities: No edema. Neurological: Alert oriented x 4. No focal deficits.  Psychological: Alert and cooperative. Normal mood and affect  Assessment and Recommendations: 20.  35 year old female with Crohn's ileocolitis on Humira 40 mg subcu every 2 weeks -Continue Humira 40 mg subcu every 2 weeks -Colonoscopy recall date 09/2021 -Flu vaccination recommended -Follow-up in our office in 6 months and as needed   2.  Anemia, secondary to #1 -CBC, iron, iron saturation, TIBC and ferritin level. -IV iron as needed per hematology   3. Constipation -Miralax Q HS PRN  4.  Patient reported a possible seizure activity versus sleep apnea as witnessed by her significant other last night -Patient instructed to contact her PCP today for further evaluation

## 2021-04-13 NOTE — Patient Instructions (Addendum)
If you are age 35 or younger, your body mass index should be between 19-25. Your Body mass index is 44.57 kg/m. If this is out of the aformentioned range listed, please consider follow up with your Primary Care Provider.   LABS:  Lab work has been ordered for you today. Our lab is located in the basement. Press "B" on the elevator. The lab is located at the first door on the left as you exit the elevator.  HEALTHCARE LAWS AND MY CHART RESULTS: Due to recent changes in healthcare laws, you may see the results of your imaging and laboratory studies on MyChart before your provider has had a chance to review them.   We understand that in some cases there may be results that are confusing or concerning to you. Not all laboratory results come back in the same time frame and the provider may be waiting for multiple results in order to interpret others.  Please give [name] 48 hours in order for your provider to thoroughly review all the results before contacting the office for clarification of your results.   RECOMMENDATIONS: Miralax- Dissolve one capful in 8 ounces of water and drink before bed as needed for constipation. Continue Humira every 2 weeks. Follow up with your primary care provider regarding possible sleep apnea and seizure activity. Follow up with [name] in 6 months.

## 2021-04-14 ENCOUNTER — Telehealth: Payer: Self-pay

## 2021-04-14 LAB — IRON,TIBC AND FERRITIN PANEL
%SAT: 12 % (calc) — ABNORMAL LOW (ref 16–45)
Ferritin: 17 ng/mL (ref 16–154)
Iron: 41 ug/dL (ref 40–190)
TIBC: 338 mcg/dL (calc) (ref 250–450)

## 2021-04-14 NOTE — Telephone Encounter (Signed)
Notified patient of results , patient expressed understanding and agreement, no further questions at this time.

## 2021-04-14 NOTE — Progress Notes (Signed)
Addendum: Reviewed and agree with assessment and management plan. Theodore Rahrig M, MD  

## 2021-04-14 NOTE — Telephone Encounter (Signed)
-----   Message from Noralyn Pick, NP sent at 04/14/2021  1:24 PM EST ----- Lenna Sciara, please call [name] and let her know her anemia has improved.  Her iron levels have also improved.  However, she has required periodic IV iron and I would prefer for her to follow-up with hematologist Dr. Lorenso Courier so he can keep track of her counts and administer IV iron as needed.  Please have her contact Dr. Libby Maw office for follow-up appointment.  Thank you.

## 2021-04-21 ENCOUNTER — Encounter: Payer: Self-pay | Admitting: Hematology and Oncology

## 2021-04-26 ENCOUNTER — Other Ambulatory Visit: Payer: Self-pay

## 2021-04-26 ENCOUNTER — Telehealth: Payer: Self-pay | Admitting: Nurse Practitioner

## 2021-04-26 MED ORDER — HUMIRA (2 SYRINGE) 40 MG/0.4ML ~~LOC~~ PSKT
40.0000 mg | PREFILLED_SYRINGE | SUBCUTANEOUS | 6 refills | Status: DC
Start: 2021-04-26 — End: 2022-01-17

## 2021-04-26 NOTE — Telephone Encounter (Signed)
error 

## 2021-05-26 ENCOUNTER — Ambulatory Visit: Payer: Medicaid Other | Admitting: Neurology

## 2021-07-07 ENCOUNTER — Encounter: Payer: Self-pay | Admitting: Neurology

## 2021-07-07 ENCOUNTER — Institutional Professional Consult (permissible substitution): Payer: Medicaid Other | Admitting: Neurology

## 2021-07-08 ENCOUNTER — Other Ambulatory Visit: Payer: Self-pay

## 2022-01-17 ENCOUNTER — Other Ambulatory Visit: Payer: Self-pay | Admitting: Internal Medicine

## 2022-04-07 ENCOUNTER — Other Ambulatory Visit (HOSPITAL_COMMUNITY): Payer: Self-pay

## 2022-04-07 ENCOUNTER — Encounter: Payer: Self-pay | Admitting: Hematology and Oncology

## 2022-04-07 ENCOUNTER — Telehealth: Payer: Self-pay | Admitting: Pharmacy Technician

## 2022-04-07 NOTE — Telephone Encounter (Signed)
Patient Advocate Encounter  Prior Authorization for HUMIRA 40MG has been approved.    PA# [id number] Effective dates: 11.24.23 through 11.23.24  Lalania Haseman B. CPhT P: [phone] F: [phone]   Received notification from [facility] that prior authorization for HUMIRA 40MG is required.   PA submitted on 11.24.23 Key [id number]  Status is pending

## 2022-05-25 ENCOUNTER — Other Ambulatory Visit: Payer: Self-pay | Admitting: Internal Medicine

## 2022-06-21 ENCOUNTER — Other Ambulatory Visit: Payer: Self-pay | Admitting: Internal Medicine

## 2022-08-18 ENCOUNTER — Other Ambulatory Visit: Payer: Self-pay | Admitting: Internal Medicine

## 2022-08-29 ENCOUNTER — Other Ambulatory Visit: Payer: Self-pay | Admitting: Nurse Practitioner

## 2022-08-29 DIAGNOSIS — E0590 Thyrotoxicosis, unspecified without thyrotoxic crisis or storm: Secondary | ICD-10-CM

## 2022-08-30 ENCOUNTER — Other Ambulatory Visit: Payer: Self-pay | Admitting: Internal Medicine

## 2022-08-30 DIAGNOSIS — S21002A Unspecified open wound of left breast, initial encounter: Secondary | ICD-10-CM

## 2022-09-13 ENCOUNTER — Other Ambulatory Visit: Payer: Medicaid Other

## 2022-09-20 ENCOUNTER — Inpatient Hospital Stay: Admission: RE | Admit: 2022-09-20 | Payer: Medicaid Other | Source: Ambulatory Visit

## 2022-09-28 ENCOUNTER — Telehealth: Payer: Self-pay | Admitting: Internal Medicine

## 2022-09-28 NOTE — Telephone Encounter (Signed)
Inbound call from patient, states she believes she is having an allergic reaction to her Humira. Patient states she gets sweats and a burning sensation whenever she injects it. Would like to discuss with a nurse.

## 2022-09-29 NOTE — Telephone Encounter (Signed)
Spoke with Julie Mcconnell and she thinks she is having an allergic reaction to her humira. States when she injects the medication it burns. States she has had a whelp on the back of her neck for over a month and it has been draining purulent drainage. Discussed with her she needed to see her PCP, reports she did and was placed on antibiotics and has an appt with surgeon on 10/02/22. Julie Mcconnell reports she is supposed to take her next humira injection in 14 days. Julie Mcconnell aware Dr. Rhea Belton is out of the office and and will reply upon his return.

## 2022-10-02 ENCOUNTER — Other Ambulatory Visit: Payer: Self-pay

## 2022-10-02 DIAGNOSIS — K5080 Crohn's disease of both small and large intestine without complications: Secondary | ICD-10-CM

## 2022-10-02 NOTE — Telephone Encounter (Signed)
Lab orders in epic. Discussed with pt that she needs to come in for labs the day before she is due for her next Humira injection. Pt verbalized understanding.

## 2022-10-02 NOTE — Telephone Encounter (Signed)
Let's 1st check CBC, CMP, CRP, fecal cal, and humira level + ab JMP

## 2022-11-17 ENCOUNTER — Other Ambulatory Visit: Payer: Medicaid Other

## 2022-11-17 DIAGNOSIS — K5080 Crohn's disease of both small and large intestine without complications: Secondary | ICD-10-CM

## 2022-11-26 LAB — SERIAL MONITORING

## 2022-11-27 ENCOUNTER — Telehealth: Payer: Self-pay | Admitting: Internal Medicine

## 2022-11-27 LAB — ADALIMUMAB+AB (SERIAL MONITOR)
Adalimumab Drug Level: <0.6 ug/mL
Anti-Adalimumab Antibody: 761 ng/mL

## 2022-11-27 NOTE — Telephone Encounter (Signed)
Please advise regarding Humira refill. Pt has not been in for an OV since 2022.

## 2022-11-27 NOTE — Telephone Encounter (Signed)
[facility]  is calling to get refill for Humira. Any further questions please call Lisa P [phone]

## 2022-11-27 NOTE — Telephone Encounter (Signed)
Patient has developed antibodies to Humira and has no drug level present For this reason we do not need to continue Humira but she needs to begin a new medication Before selecting a new medication she needs to be seen back in our office for follow-up and additional lab work at the time of office visit

## 2022-11-27 NOTE — Telephone Encounter (Signed)
Spoke with pt and she is aware of appt and Dr. Lauro Franklin recommendations.

## 2023-01-08 ENCOUNTER — Ambulatory Visit: Payer: Medicaid Other | Admitting: Internal Medicine

## 2023-04-11 ENCOUNTER — Ambulatory Visit: Payer: Medicaid Other

## 2023-04-11 VITALS — BP 145/87 | HR 73 | Ht 65.5 in | Wt 275.0 lb

## 2023-04-11 DIAGNOSIS — Z23 Encounter for immunization: Secondary | ICD-10-CM | POA: Diagnosis not present

## 2023-04-16 NOTE — Congregational Nurse Program (Signed)
  Dept: [phone]   Congregational Nurse Program Note  Date of Encounter: 03/27/2023  Clinic visit to have blood pressure checked and for complaint of abdomen pain, BP 126/86, pulse 89 and regular.  No nausea, vomiting or other abdominal symptoms. Referred to [facility] to see MD on 03/28/2023.  Past Medical History: Past Medical History:  Diagnosis Date   Allergy    Anemia    Anxiety    Bipolar disorder (HCC)    Crohn's colitis (HCC)    Depression    GERD (gastroesophageal reflux disease)    occasional w/pregnancy, diet controlled, no meds   History of chronic bronchitis 2012   last episode 3-4 yrs ago    History of concussion    child--  no residual   SVD (spontaneous vaginal delivery)    x 3    Encounter Details:  Community Questionnaire - 03/27/23 1140       Questionnaire   Ask client: Do you give verbal consent for me to treat you today? Yes    Student Assistance N/A    Location Patient Served  [facility]    Encounter Setting CN site    Population Status Unhoused    Insurance Medicaid    Insurance/Financial Assistance Referral N/A

## 2023-04-19 ENCOUNTER — Encounter: Payer: Self-pay | Admitting: Physician Assistant

## 2023-04-19 NOTE — Progress Notes (Signed)
She cut her R 4th finger yesterday on a razor blade. It was a new blade, the cover was not on the blade as it would normally be.   The bleeding was a bit hard to control, but was stopped.  She has been using neosporin ointment with bandaids.   Pt has had flu and Covid vaccines.   Needs PNA vaccine because of her Crohn's dz, was requested to get this from her PCP >> now  [facility].   Theodore Demark, PA-C 04/19/2023 11:41 AM

## 2023-06-05 ENCOUNTER — Encounter: Payer: Self-pay | Admitting: Internal Medicine

## 2023-06-05 ENCOUNTER — Other Ambulatory Visit (HOSPITAL_COMMUNITY): Payer: Self-pay

## 2023-06-05 ENCOUNTER — Ambulatory Visit: Payer: Medicaid Other | Admitting: Internal Medicine

## 2023-06-05 ENCOUNTER — Encounter: Payer: Self-pay | Admitting: Hematology and Oncology

## 2023-06-05 ENCOUNTER — Telehealth: Payer: Self-pay | Admitting: Pharmacy Technician

## 2023-06-05 ENCOUNTER — Encounter: Payer: Self-pay | Admitting: Pharmacy Technician

## 2023-06-05 ENCOUNTER — Other Ambulatory Visit (INDEPENDENT_AMBULATORY_CARE_PROVIDER_SITE_OTHER): Payer: Medicaid Other

## 2023-06-05 VITALS — BP 130/70 | HR 78 | Ht 65.5 in | Wt 262.0 lb

## 2023-06-05 DIAGNOSIS — K5080 Crohn's disease of both small and large intestine without complications: Secondary | ICD-10-CM

## 2023-06-05 DIAGNOSIS — Z79899 Other long term (current) drug therapy: Secondary | ICD-10-CM | POA: Diagnosis not present

## 2023-06-05 DIAGNOSIS — D649 Anemia, unspecified: Secondary | ICD-10-CM | POA: Diagnosis not present

## 2023-06-05 DIAGNOSIS — Z5181 Encounter for therapeutic drug level monitoring: Secondary | ICD-10-CM | POA: Diagnosis not present

## 2023-06-05 LAB — CBC WITH DIFFERENTIAL/PLATELET
Basophils Absolute: 0 10*3/uL (ref 0.0–0.1)
Basophils Relative: 0.5 % (ref 0.0–3.0)
Eosinophils Absolute: 0 10*3/uL (ref 0.0–0.7)
Eosinophils Relative: 0.5 % (ref 0.0–5.0)
HCT: 37.7 % (ref 36.0–46.0)
Hemoglobin: 12.2 g/dL (ref 12.0–15.0)
Lymphocytes Relative: 14.6 % (ref 12.0–46.0)
Lymphs Abs: 1.3 10*3/uL (ref 0.7–4.0)
MCHC: 32.4 g/dL (ref 30.0–36.0)
MCV: 89.3 fL (ref 78.0–100.0)
Monocytes Absolute: 0.8 10*3/uL (ref 0.1–1.0)
Monocytes Relative: 8.9 % (ref 3.0–12.0)
Neutro Abs: 6.5 10*3/uL (ref 1.4–7.7)
Neutrophils Relative %: 75.5 % (ref 43.0–77.0)
Platelets: 337 10*3/uL (ref 150.0–400.0)
RBC: 4.22 Mil/uL (ref 3.87–5.11)
RDW: 18.5 % — ABNORMAL HIGH (ref 11.5–15.5)
WBC: 8.6 10*3/uL (ref 4.0–10.5)

## 2023-06-05 LAB — COMPREHENSIVE METABOLIC PANEL
ALT: 12 U/L (ref 0–35)
AST: 12 U/L (ref 0–37)
Albumin: 4.2 g/dL (ref 3.5–5.2)
Alkaline Phosphatase: 59 U/L (ref 39–117)
BUN: 12 mg/dL (ref 6–23)
CO2: 26 meq/L (ref 19–32)
Calcium: 9.3 mg/dL (ref 8.4–10.5)
Chloride: 102 meq/L (ref 96–112)
Creatinine, Ser: 0.42 mg/dL (ref 0.40–1.20)
GFR: 124.42 mL/min (ref >60.00)
Glucose, Bld: 119 mg/dL — ABNORMAL HIGH (ref 70–99)
Potassium: 4.2 meq/L (ref 3.5–5.1)
Sodium: 136 meq/L (ref 135–145)
Total Bilirubin: 0.3 mg/dL (ref 0.2–1.2)
Total Protein: 7.7 g/dL (ref 6.0–8.3)

## 2023-06-05 MED ORDER — RINVOQ 15 MG PO TB24: 1.0000 | ORAL_TABLET | Freq: Every day | ORAL | 5 refills | Status: DC

## 2023-06-05 MED ORDER — RINVOQ 45 MG PO TB24: 1.0000 | ORAL_TABLET | Freq: Every day | ORAL | 2 refills | Status: DC

## 2023-06-05 NOTE — Progress Notes (Signed)
Subjective:    Patient ID: Julie Mcconnell, female    DOB: [birth year], 38 y.o.   MRN: [id number]  HPI Julie Mcconnell is a 38 year old female with a history of ileocolonic Crohn's disease (diagnosis January 2015) with previous exposure to Humira stopped in summer 2024 due to antibody formation and absent drug level, Cimzia and Remicade stopped after infusion reaction, IDA likely secondary to IBD who is here for follow-up.  She has not been seen here since 04/13/2021.  She is here today with a female companion.  She reports that she did get information from [name] about absent Humira level with positive drug antibody in the summer 2024.  Humira was stopped and she has been waiting on an appointment.  She had had recurrent abdominal pain, bloody diarrhea with mucus.  She was started by primary care on prednisone and is taking 20 mg daily for the last nearly 8 weeks.  With this her abdominal pain has resolved.  No rashes, joint pains, ocular complaint or oral ulcers.   Review of Systems As per HPI, otherwise negative  Current Medications, Allergies, Past Medical History, Past Surgical History, Family History and Social History were reviewed in [employer] record.     Objective:   Physical Exam BP 130/70 Comment: large cuff  Pulse 78   Ht 5' 5.5" (1.664 m)   Wt 262 lb (118.8 kg)   BMI 42.94 kg/m  Gen: awake, alert, NAD HEENT: anicteric  CV: RRR, no mrg Pulm: CTA b/l Abd: soft, NT/ND, +BS throughout Ext: no c/c/e Neuro: nonfocal  Adalimumab level 11/17/2022 = less than 0.6; antibody 761  Colonoscopy 10/02/2018: - Preparation of the colon was poor in the right colon. A large amount of semi-solid solid stool was found in the right colon, making visualization difficult. Scattered small amounts of stool in the left colon, but views in the left colon better. - Mild inflammation was found in the proximal transverse colon, in the ascending colon and in the cecum secondary to  Crohn's disease with colonic involvement. Biopsied. - Normal mucosa in the rectum, in the sigmoid colon, in the descending colon and in the distal transverse colon. Biopsied. - The distal rectum and anal verge are normal on retroflexion view. Biopsies: 1. Surgical [P], right colon biopsies - MILDLY ACTIVE CHRONIC COLITIS, CONSISTENT WITH PATIENT'S CLINICAL HISTORY OF CROHN'S DISEASE - GRANULOMAS ARE NOT IDENTIFIED - NEGATIVE FOR DYSPLASIA 2. Surgical [P], left colon biopsies - INACTIVE CHRONIC COLITIS, CONSISTENT WITH PATIENT'S CLINICAL HISTORY OF CROHN'S DISEASE - GRANULOMAS ARE NOT IDENTIFIED - NEGATIVE FOR DYSPLASIA    Assessment & Plan:  38 year old female with a history of ileocolonic Crohn's disease (diagnosis January 2015) with previous exposure to Humira stopped in summer 2024 due to antibody formation and absent drug level, Cimzia and Remicade stopped after infusion reaction, IDA likely secondary to IBD who is here for follow-up.   Ileocolonic Crohn's disease (dx 2015; prior use of infliximab, adalimumab and certulizmab -- stopped due to Ab) --she had recurrent activity of her Crohn's disease based on symptoms after Humira was completely neutralized by antibodies.  She has been restarted on prednisone by primary care and is currently taking 20 mg daily and seems to be in clinical remission.  We had a long discussion today regarding additional therapies and we discussed Rinvoq today.  This is oral daily which is a preferred route for her.  She does not have a history of already vascular disease or DVT.  I reminded her  not to use tobacco.  After fully discussing the risk, benefits and alternatives she wishes to try this therapy which hopefully will be very effective for her -- Continue prednisone 20 mg daily for 4 weeks and then decrease to 10 mg daily for 4 weeks and then stop --Update quantiferon gold and viral hepatitis serologies; also check liver enzymes -- Begin Rinvoq 45 mg daily x  12 weeks and then reduce to 15 mg daily chronically -- Return in 3 months for fecal calprotectin and consideration of colonoscopy; surveillance colonoscopy is recommended this year  2.  Anemia and history of IDA --check CBC, CMP today  40 minutes total spent today including patient facing time, coordination of care, reviewing medical history/procedures/pertinent radiology studies, and documentation of the encounter.

## 2023-06-05 NOTE — Patient Instructions (Addendum)
Your provider has requested that you go to the basement level for lab work before leaving today. Press "B" on the elevator. The lab is located at the first door on the left as you exit the elevator.  Remain on prednisone 20 mg x 4 weeks, then reduce to 10 mg x 4 weeks, then stop.  Start Rinvoq 45 mg daily x 3 months. Then remain on Rinvoq 15 mg daily long-term. We have sent prescriptions to your pharmacy. These medications will need prior authorizations. We will work on that.   _______________________________________________________  If your blood pressure at your visit was 140/90 or greater, please contact your primary care physician to follow up on this.  _______________________________________________________  If you are age 23 or older, your body mass index should be between 23-30. Your Body mass index is 42.94 kg/m. If this is out of the aforementioned range listed, please consider follow up with your Primary Care Provider.  If you are age 36 or younger, your body mass index should be between 19-25. Your Body mass index is 42.94 kg/m. If this is out of the aformentioned range listed, please consider follow up with your Primary Care Provider.   ________________________________________________________  The [facility] providers would like to encourage you to use [facility] to communicate with providers for non-urgent requests or questions.  Due to long hold times on the telephone, sending your provider a message by [facility] may be a faster and more efficient way to get a response.  Please allow 48 business hours for a response.  Please remember that this is for non-urgent requests.  _______________________________________________________

## 2023-06-05 NOTE — Telephone Encounter (Signed)
 A user error has taken place: encounter opened in error, closed for administrative reasons.

## 2023-06-05 NOTE — Telephone Encounter (Signed)
Pharmacy Patient Advocate Encounter   Received notification from Patient Pharmacy that prior authorization for [facility] 45MG  is required/requested.   Insurance verification completed.   The patient is insured through [employer] .   Per test claim: PA required; PA started via CoverMyMeds. KEY [id number] . Please see clinical question(s) below that I am not finding the answer to in her chart and advise.

## 2023-06-06 ENCOUNTER — Other Ambulatory Visit: Payer: Medicaid Other

## 2023-06-08 LAB — HEPATITIS B SURFACE ANTIBODY,QUALITATIVE: Hep B S Ab: REACTIVE — AB

## 2023-06-08 LAB — QUANTIFERON-TB GOLD PLUS
Mitogen-NIL: >10 [IU]/mL
NIL: 0.06 [IU]/mL
QuantiFERON-TB Gold Plus: NEGATIVE
TB1-NIL: <0 [IU]/mL
TB2-NIL: <0 [IU]/mL

## 2023-06-08 LAB — HEPATITIS C ANTIBODY: Hepatitis C Ab: NONREACTIVE

## 2023-06-08 LAB — HEPATITIS B SURFACE ANTIGEN: Hepatitis B Surface Ag: NONREACTIVE

## 2023-06-08 LAB — HEPATITIS B CORE ANTIBODY, TOTAL: Hep B Core Total Ab: NONREACTIVE

## 2023-06-11 ENCOUNTER — Other Ambulatory Visit (HOSPITAL_COMMUNITY): Payer: Self-pay

## 2023-06-11 NOTE — Telephone Encounter (Signed)
TB results have been added. PA has been submitted to insurance now.

## 2023-06-14 ENCOUNTER — Telehealth: Payer: Self-pay | Admitting: Pharmacy Technician

## 2023-06-14 ENCOUNTER — Encounter: Payer: Self-pay | Admitting: Hematology and Oncology

## 2023-06-14 ENCOUNTER — Other Ambulatory Visit (HOSPITAL_COMMUNITY): Payer: Self-pay

## 2023-06-14 NOTE — Telephone Encounter (Signed)
Pharmacy Patient Advocate Encounter  Insurance verification completed.   The patient is insured through [facility]   Ran test claim for [facility] 15MG . Currently a quantity of 90 is a 90 day supply and the co-pay is 4 .  This test claim was processed through [facility]- copay amounts may vary at other pharmacies due to pharmacy/plan contracts, or as the patient moves through the different stages of their insurance plan.

## 2023-06-14 NOTE — Telephone Encounter (Signed)
Do you know what specialty pharmacy she needs to use?

## 2023-06-14 NOTE — Telephone Encounter (Signed)
Pharmacy Patient Advocate Encounter   Received notification from CoverMyMeds that prior authorization for [facility] 15MG  is required/requested.   Insurance verification completed.   The patient is insured through [facility] .   Per test claim: PA required; PA submitted to above mentioned insurance via CoverMyMeds Key/confirmation #/EOC [id number] Status is pending

## 2023-06-14 NOTE — Telephone Encounter (Signed)
Pharmacy Patient Advocate Encounter  Received notification from [facility] that Prior Authorization for [facility] 15MG  has been APPROVED from 1.30.25 to 1.30.26   PA #/Case ID/Reference #: [id number]

## 2023-06-15 ENCOUNTER — Other Ambulatory Visit: Payer: Self-pay

## 2023-06-15 MED ORDER — RINVOQ 15 MG PO TB24
1.0000 | ORAL_TABLET | Freq: Every day | ORAL | 5 refills | Status: AC
  Filled 2023-06-21: qty 30, 30d supply, fill #0

## 2023-06-15 MED ORDER — RINVOQ 45 MG PO TB24
1.0000 | ORAL_TABLET | Freq: Every day | ORAL | 2 refills | Status: DC
  Filled 2023-06-21: qty 28, 28d supply, fill #0

## 2023-06-15 NOTE — Telephone Encounter (Signed)
Spoke with pt and let her know [facility] cannot fill the rinvoq for her. Prescription sent to [facility] pharmacy for pt and she is aware.

## 2023-06-15 NOTE — Telephone Encounter (Signed)
[facility] called and advised they would not be able to accommodate the Rinvoq prescription. Please advise.

## 2023-06-18 ENCOUNTER — Other Ambulatory Visit: Payer: Self-pay | Admitting: Pharmacy Technician

## 2023-06-18 ENCOUNTER — Other Ambulatory Visit: Payer: Self-pay

## 2023-06-21 ENCOUNTER — Other Ambulatory Visit (HOSPITAL_COMMUNITY): Payer: Self-pay

## 2023-06-21 ENCOUNTER — Other Ambulatory Visit: Payer: Self-pay

## 2023-06-21 NOTE — Telephone Encounter (Signed)
 I just spoke with the patient and let her know she needs to call them back at the number she has or at least answer the phone when they call her tomorrow.

## 2023-06-21 NOTE — Progress Notes (Signed)
 Specialty Pharmacy Initiation Note   Julie Mcconnell is a 38 y.o. female who will be followed by the specialty pharmacy service for RxSp Crohn's Disease    Review of administration, indication, effectiveness, safety, potential side effects, storage/disposable, and missed dose instructions occurred today for patient's specialty medication(s) Upadacitinib  (Rinvoq )     Patient/Caregiver did not have any additional questions or concerns.   Patient's therapy is appropriate to: Initiate    Goals Addressed             This Visit's Progress    Stabilization of disease       Patient is initiating therapy. Patient will maintain adherence         Julie Mcconnell Specialty Pharmacist

## 2023-06-22 ENCOUNTER — Other Ambulatory Visit: Payer: Self-pay

## 2023-06-25 ENCOUNTER — Other Ambulatory Visit: Payer: Self-pay

## 2023-06-26 ENCOUNTER — Other Ambulatory Visit: Payer: Self-pay

## 2023-07-10 ENCOUNTER — Other Ambulatory Visit: Payer: Self-pay

## 2023-07-12 ENCOUNTER — Other Ambulatory Visit: Payer: Self-pay

## 2023-07-16 ENCOUNTER — Other Ambulatory Visit: Payer: Self-pay

## 2023-07-18 ENCOUNTER — Other Ambulatory Visit: Payer: Self-pay

## 2023-07-18 ENCOUNTER — Other Ambulatory Visit (HOSPITAL_COMMUNITY): Payer: Self-pay

## 2023-07-18 ENCOUNTER — Encounter (HOSPITAL_COMMUNITY): Payer: Self-pay

## 2023-07-18 ENCOUNTER — Emergency Department (HOSPITAL_COMMUNITY)
Admission: EM | Admit: 2023-07-18 | Discharge: 2023-07-19 | Disposition: A | Discharge status: Home / Self Care | Attending: Emergency Medicine | Admitting: Emergency Medicine

## 2023-07-18 DIAGNOSIS — M7989 Other specified soft tissue disorders: Secondary | ICD-10-CM | POA: Diagnosis present

## 2023-07-18 DIAGNOSIS — R600 Localized edema: Secondary | ICD-10-CM | POA: Insufficient documentation

## 2023-07-18 LAB — CBC WITH DIFFERENTIAL/PLATELET

## 2023-07-18 NOTE — ED Triage Notes (Signed)
Bi lateral leg swelling for 3 weeks.

## 2023-07-19 LAB — COMPREHENSIVE METABOLIC PANEL
ALT: 19 U/L (ref 0–44)
AST: 19 U/L (ref 15–41)
Albumin: 3.5 g/dL (ref 3.5–5.0)
Alkaline Phosphatase: 73 U/L (ref 38–126)
Anion gap: 6 (ref 5–15)
BUN: 10 mg/dL (ref 6–20)
CO2: 27 mmol/L (ref 22–32)
Calcium: 8.7 mg/dL — ABNORMAL LOW (ref 8.9–10.3)
Chloride: 104 mmol/L (ref 98–111)
Creatinine, Ser: 0.47 mg/dL (ref 0.44–1.00)
GFR, Estimated: >60 mL/min (ref >60)
Glucose, Bld: 94 mg/dL (ref 70–99)
Potassium: 4.2 mmol/L (ref 3.5–5.1)
Sodium: 137 mmol/L (ref 135–145)
Total Bilirubin: 0.4 mg/dL (ref 0.0–1.2)
Total Protein: 7 g/dL (ref 6.5–8.1)

## 2023-07-19 LAB — CBC WITH DIFFERENTIAL/PLATELET
Basophils Relative: 0 10*3/uL (ref 0.0–0.1)
Eosinophils Absolute: 1 10*3/uL (ref 0.0–0.5)
Eosinophils Relative: 0.1 10*3/uL (ref 0.0–0.5)
HCT: 34.2 % — ABNORMAL LOW (ref 36.0–46.0)
Hemoglobin: 10.5 g/dL — ABNORMAL LOW (ref 12.0–15.0)
Lymphocytes Relative: 66 %
Lymphs Abs: 3.4 10*3/uL (ref 0.7–4.0)
MCH: 28.4 pg (ref 26.0–34.0)
MCHC: 30.7 g/dL (ref 30.0–36.0)
MCV: 92.4 fL (ref 80.0–100.0)
Monocytes Absolute: 2 10*3/uL (ref 0.1–1.0)
Monocytes Relative: 0.4 10*3/uL (ref 0.1–1.0)
Monocytes Relative: 9 10*3/uL (ref 0.7–4.0)
Neutro Abs: 1.1 10*3/uL — ABNORMAL LOW (ref 1.7–7.7)
Neutrophils Relative %: 22 %
Other: 0.01 10*3/uL (ref 0.00–0.07)
Platelets: 379 10*3/uL (ref 150–400)
RBC: 3.7 MIL/uL — ABNORMAL LOW (ref 3.87–5.11)
RDW: 16.2 % — ABNORMAL HIGH (ref 11.5–15.5)
Smear Review: 0 %
WBC: 5 10*3/uL (ref 4.0–10.5)
nRBC: 0 % (ref 0.0–0.2)

## 2023-07-19 LAB — PREGNANCY, URINE: Preg Test, Ur: NEGATIVE

## 2023-07-19 LAB — PATHOLOGIST SMEAR REVIEW

## 2023-07-19 LAB — BRAIN NATRIURETIC PEPTIDE: B Natriuretic Peptide: 23.3 pg/mL (ref 0.0–100.0)

## 2023-07-19 MED ORDER — FUROSEMIDE 40 MG PO TABS
40.0000 mg | ORAL_TABLET | Freq: Once | ORAL | Status: AC
  Administered 2023-07-19: 40 mg via ORAL
  Filled 2023-07-19: qty 1

## 2023-07-19 MED ORDER — FUROSEMIDE 20 MG PO TABS: 40.0000 mg | ORAL_TABLET | Freq: Every day | ORAL | 0 refills | Status: DC

## 2023-07-19 MED ORDER — FUROSEMIDE 20 MG PO TABS: 40.0000 mg | ORAL_TABLET | Freq: Every day | ORAL | 0 refills | Status: AC

## 2023-07-19 MED ORDER — POTASSIUM CHLORIDE CRYS ER 20 MEQ PO TBCR
20.0000 meq | EXTENDED_RELEASE_TABLET | Freq: Two times a day (BID) | ORAL | 0 refills | Status: DC
Start: 2023-07-19 — End: 2023-07-19

## 2023-07-19 MED ORDER — POTASSIUM CHLORIDE CRYS ER 20 MEQ PO TBCR: 20.0000 meq | EXTENDED_RELEASE_TABLET | Freq: Two times a day (BID) | ORAL | 0 refills | Status: AC

## 2023-07-19 MED ORDER — FUROSEMIDE 10 MG/ML IJ SOLN
40.0000 mg | Freq: Once | INTRAMUSCULAR | Status: DC
  Filled 2023-07-19: qty 4

## 2023-07-19 NOTE — ED Provider Notes (Signed)
 [place] [facility] DEPARTMENT AT [facility] Provider Note   CSN: [id number] Arrival date & time: 07/18/23  2250     History  Chief Complaint  Patient presents with   Leg Swelling   HPI Julie Mcconnell is a 38 y.o. female with history of bipolar disorder, GERD presenting for bilateral leg swelling.  She states has been going on for 3 weeks.  Denies associated shortness of breath or chest pain.  Denies any calf tenderness, OCP use, recent immobilization.  HPI     Home Medications Prior to Admission medications   Medication Sig Start Date End Date Taking? Authorizing Provider  norethindrone (MICRONOR) 0.35 MG tablet Take 1 tablet by mouth daily. 05/21/23   [provider]  predniSONE (DELTASONE) 10 MG tablet 20 mg daily    [provider]  Upadacitinib ER (RINVOQ) 15 MG TB24 Take 1 tablet (15 mg total) by mouth daily. 06/15/23   Pyrtle, Carie Caddy, MD  Upadacitinib ER (RINVOQ) 45 MG TB24 Take 1 tablet (45 mg total) by mouth daily. 06/15/23   Pyrtle, Carie Caddy, MD      Allergies    Humira (2 pen) [adalimumab], Cimzia [certolizumab pegol], and Remicade [infliximab]    Review of Systems   See HPI  Physical Exam Updated Vital Signs BP 137/79 (BP Location: Left Arm)   Pulse 81   Temp 97.9 F (36.6 C) (Oral)   Resp 19   Ht 5' 5.5" (1.664 m)   Wt 121.1 kg   SpO2 97%   BMI 43.76 kg/m  Physical Exam Vitals and nursing note reviewed.  HENT:     Head: Normocephalic and atraumatic.     Mouth/Throat:     Mouth: Mucous membranes are moist.  Eyes:     General:        Right eye: No discharge.        Left eye: No discharge.     Conjunctiva/sclera: Conjunctivae normal.  Cardiovascular:     Rate and Rhythm: Normal rate and regular rhythm.     Pulses: Normal pulses.     Heart sounds: Normal heart sounds.  Pulmonary:     Effort: Pulmonary effort is normal.     Breath sounds: Normal breath sounds.  Abdominal:     General: Abdomen is flat.     Palpations:  Abdomen is soft.  Musculoskeletal:     Comments: 1+ pitting edema noted in both lower extremities.  No calf tenderness with palpation.  No erythema noted.  Pedal pulses are 2+ bilaterally.  Skin:    General: Skin is warm and dry.  Neurological:     General: No focal deficit present.  Psychiatric:        Mood and Affect: Mood normal.     ED Results / Procedures / Treatments   Labs (all labs ordered are listed, but only abnormal results are displayed) Labs Reviewed  COMPREHENSIVE METABOLIC PANEL - Abnormal; Notable for the following components:      Result Value   Calcium 8.7 (*)    All other components within normal limits  CBC WITH DIFFERENTIAL/PLATELET - Abnormal; Notable for the following components:   RBC 3.70 (*)    Hemoglobin 10.5 (*)    HCT 34.2 (*)    RDW 16.2 (*)    Neutro Abs 1.1 (*)    All other components within normal limits  BRAIN NATRIURETIC PEPTIDE  PREGNANCY, URINE  PATHOLOGIST SMEAR REVIEW    EKG None  Radiology No results found.  Procedures Procedures    Medications Ordered in ED Medications  furosemide (LASIX) tablet 40 mg (has no administration in time range)    ED Course/ Medical Decision Making/ A&P                                 Medical Decision Making Amount and/or Complexity of Data Reviewed Labs: ordered. Radiology: ordered.   37 year old well-appearing female presenting for lower extremity swelling.  Exam notable for 1+ pitting edema in both lower legs.  DDx includes DVT, CHF exacerbation, PE, venous stasis, other.  I personally reviewed and interpreted her labs which did not reveal any acute derangements.  Considered CHF but unlikely given no chest pain or shortness of breath and BNP is normal.  Also considered DVT but unlikely given symmetric edema without calf tenderness.  PE also unlikely given no chest pain or shortness of breath and vitals reassuring.  Suspect this could be venous stasis.  Started on p.o. Lasix.  Sent her home  on a 5-day course of Lasix with a potassium supplement.  Advised her to follow-up with her PCP.  Discussed per return precautions.  Discharged good condition.        Final Clinical Impression(s) / ED Diagnoses Final diagnoses:  Bilateral lower extremity edema    Rx / DC Orders ED Discharge Orders     None         Gareth Eagle, PA-C 07/19/23 0330    Anders Simmonds T, DO 07/26/23 [phone]

## 2023-07-19 NOTE — Discharge Instructions (Signed)
 Evaluation today was overall reassuring.  I am starting him on Lasix which is a diuretic.  It will make you urinate which will serve to alleviate some of the swelling in your lower legs.  I also am starting you on a potassium supplement as well as you can lose your potassium stores with Lasix.  Recommend you follow with PCP.  If you develop shortness of breath, chest pain or any other concerning symptom please return emergency department for further evaluation.

## 2023-08-13 ENCOUNTER — Telehealth: Payer: Self-pay | Admitting: Pharmacy Technician

## 2023-08-13 NOTE — Telephone Encounter (Signed)
 I have contacted patient and asked that she reach out to [facility] to get set up for Rinvoq delivery. [facility] phone number provided to patient. She states she will contact them.

## 2023-08-15 ENCOUNTER — Other Ambulatory Visit: Payer: Self-pay

## 2023-09-04 ENCOUNTER — Ambulatory Visit: Payer: Medicaid Other | Admitting: Internal Medicine

## 2024-03-13 ENCOUNTER — Other Ambulatory Visit: Payer: Self-pay

## 2024-03-13 NOTE — Progress Notes (Signed)
 Disenrolled - rinvoq  last filled on 02.11.25 (261 days ago). Call center unsuccessful in reaching patient and patient has not followed up with pharmacy.

## 2024-04-12 ENCOUNTER — Encounter: Payer: Self-pay | Admitting: Internal Medicine

## 2024-04-28 ENCOUNTER — Telehealth: Payer: Self-pay | Admitting: Internal Medicine

## 2024-04-28 NOTE — Telephone Encounter (Signed)
 Inbound call from patient stating that she is needing for someone to get in touch with her on why Dr. Albertus has stopped her medication for Rinvoq . Patient is stating that if she needs an appointment why don't we just schedule one for her and just advise her. Please advise.

## 2024-04-28 NOTE — Telephone Encounter (Signed)
 Spoke to patient who wants to know why her Rinvoq  was stopped. Per chart review, it does not appear that her Rinvoq  was stopped, but rather that she did not reach out to [facility] to set up shipment as we discussed with her 08/13/23. I asked patient if she reached out to [facility] as requested and she states that she did this but nobody would answer or call her back. Says she probably has not taken the Rinvoq  in 3 months (though again has not had any meds sent by us  since at least 07/2023). Patient requests to have Rinvoq  sent to [facility] instead. Since there has been a lapse in treatment, would there need to be any change to the dosage of Rinvoq  sent to the pharmacy for her?  I did advise her that she is due for a colonoscopy and will need to set this up and have completed in order to receive additional Rinvoq .   Seems there have been some noncompliance issues with office visit no shows etc.

## 2024-04-29 NOTE — Telephone Encounter (Signed)
Attempted to reach patient by phone/ Voice mail full.

## 2024-04-29 NOTE — Telephone Encounter (Signed)
 You are correct, chronic no show patient.  These medications while very helpful do require the patient to be dedicated to medication compliance but also follow-up with us  without missing appointments. I would have her come back to see me or APP and set up a colonoscopy after which we can resume biologic therapy if appropriate JMP

## 2024-04-30 NOTE — Telephone Encounter (Signed)
 Again attempted to reach patient. No answer, voicemail full.

## 2024-05-01 NOTE — Telephone Encounter (Signed)
 3rd attempt to reach patient by phone. Again, no answer, no voicemail. Will await further correspondence from patient before continued attempts to reach back out to her.  She will need an office visit prior to any medications being sent to her pharmacy as per Dr Albertus.

## 2024-05-12 ENCOUNTER — Telehealth: Payer: Self-pay | Admitting: Internal Medicine

## 2024-05-12 MED ORDER — RINVOQ 45 MG PO TB24: 1.0000 | ORAL_TABLET | Freq: Every day | ORAL | 0 refills | Status: AC

## 2024-05-12 NOTE — Telephone Encounter (Signed)
 Inbound call from patient stating that she is needing for her Rin[facility]  [facility]. Patient is requesting a call back to inform her that the medication has been sent over. Please advise.

## 2024-05-12 NOTE — Telephone Encounter (Signed)
 Inbound call from patient stating she needs prior authorization for medication rinvoq . Please advise  Thank you

## 2024-05-13 ENCOUNTER — Other Ambulatory Visit (HOSPITAL_COMMUNITY): Payer: Self-pay

## 2024-05-13 ENCOUNTER — Encounter: Payer: Self-pay | Admitting: Hematology and Oncology

## 2024-05-13 NOTE — Telephone Encounter (Signed)
 Prior authorization on file effective until 06-13-2024. Test claim shows $4.00 co-pay

## 2024-05-22 ENCOUNTER — Encounter: Payer: Self-pay | Admitting: Hematology and Oncology

## 2024-05-22 ENCOUNTER — Other Ambulatory Visit (HOSPITAL_COMMUNITY): Payer: Self-pay

## 2024-05-22 ENCOUNTER — Telehealth: Payer: Self-pay

## 2024-05-22 NOTE — Telephone Encounter (Signed)
 Pharmacy Patient Advocate Encounter   Received notification from Pt Calls Messages that prior authorization for Rinvoq  15MG  er tablets is required/requested.   Insurance verification completed.   The patient is insured through [employer].   Prior Authorization form/request asks a question that requires your assistance. Please see the question below and advise accordingly. The PA will not be submitted until the necessary information is received.

## 2024-05-22 NOTE — Telephone Encounter (Signed)
 Patient called stating the pharmacy states they have not got prior auth for Rinvoq  and is needing it. Please advise.

## 2024-05-22 NOTE — Telephone Encounter (Signed)
 Pt has not been on previous immunomodulator therapy She has used 3 prior anti-TNF therapies before Rinvoq 

## 2024-05-23 NOTE — Telephone Encounter (Signed)
 Please advise on second question. If answer is no, insurance requires a documentation explanation why member cannot use an immunomodulator

## 2024-05-23 NOTE — Telephone Encounter (Signed)
 The answer to the second question is no.  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  Immunomodulator therapy and ileocolonic Crohn's disease is most effective when used in combination with anti-TNF however she has developed antibodies or lack of efficacy to prior anti-TNF therapy.  Commend  Upadacitinib  demonstrates superior efficacy compared to immunomodulator monotherapy for moderate-to-severe Crohn's disease, including ileocolonic disease, with clinical remission rates of 38.9-49.5% at 12 weeks versus 30% for thiopurine monotherapy at 26 weeks. --references for this medical opinion and justification: 1.Upadacitinib  Induction and Maintenance Therapy for Crohns Disease. The New England Journal of Medicine. 2023. Loftus EV, Pans J, Lacerda AP, et al. 2.Management of Crohn Disease: A Review. The Journal of the American Medical Association. 2021. Cushing MARLA Lush PDR.

## 2024-05-26 ENCOUNTER — Encounter: Payer: Self-pay | Admitting: Hematology and Oncology

## 2024-05-26 ENCOUNTER — Telehealth: Payer: Self-pay

## 2024-05-26 ENCOUNTER — Other Ambulatory Visit (HOSPITAL_COMMUNITY): Payer: Self-pay

## 2024-05-26 NOTE — Telephone Encounter (Signed)
 Pharmacy Patient Advocate Encounter   Received notification from [employer] that prior authorization for Rinvoq  15MG  er tablets  is required/requested.   Insurance verification completed.   The patient is insured through [facility].   Per test claim: PA required; PA submitted to above mentioned insurance via [employer] Key/confirmation #/EOC Rinvoq  15MG  er tablets Status is pending

## 2024-05-26 NOTE — Telephone Encounter (Signed)
 Pharmacy Patient Advocate Encounter  Per test claim: PA required; PA submitted to above mentioned insurance via [employer] Key/confirmation #/[id number] Status is pending   This was submitted with provided information. However, since patient has not been seen since starting medication in 06-2023, there is possibility of denial due to no updated office visit documentation.

## 2024-05-26 NOTE — Telephone Encounter (Signed)
 Pharmacy Patient Advocate Encounter  Received notification from [facility] [employer] that Prior Authorization for Rinvoq  15MG  er tablets has been CANCELLED by the processor we sent it to: Resubmitted through [employer] to [facility]   PA #/Case ID/Reference #: [id number]

## 2024-05-27 NOTE — Telephone Encounter (Signed)
 Patient needs an office visit with Dr Albertus as previously notated. Refill should not have been sent of a biologic medication without appropriate follow up.

## 2024-05-27 NOTE — Telephone Encounter (Signed)
 Call from pharmacy regarding Rinvoq  15MG  dosing duration. Please advise. Thank you.

## 2024-05-29 NOTE — Telephone Encounter (Signed)
 Pharmacy Patient Advocate Encounter  Received notification from [facility] [employer] that Prior Authorization for Rinvoq  15MG  er tablets has been DENIED.  Full denial letter will be uploaded to the media tab. See denial reason below.  Unfortunately, the request has been denied due to the following reason:  (1) documentation of one of the following within the last 12 months:  A) a negative tuberculosis (TB- a contagious infection that mainly affects the lungs) test or  B) a positive TB test and have ruled out active infection and started treatment for latent TB  (2) have moderate to high disease activity  (3) trial and failure or are unable to use a tumor necrosis factor (TNF) alpha inhibitor such as Humira  (may be requested through a prior authorization)  (4) the requested medication will not be used at the same time with other biologics (such as Humira  or Stelara), targeted synthetic drugs (such as Earma), or potent immunosuppressants (such as azathioprine or cyclosporine)  PA #/Case ID/Reference #: [id number]

## 2024-05-29 NOTE — Telephone Encounter (Signed)
 Noted

## 2024-05-29 NOTE — Telephone Encounter (Signed)
 Patient is scheduled to see Nestor Blower, PA-C on 06/16/24 for follow up.

## 2024-06-16 ENCOUNTER — Ambulatory Visit: Admitting: Gastroenterology

## 2024-07-24 ENCOUNTER — Encounter: Payer: Self-pay | Admitting: Obstetrics and Gynecology
